# Patient Record
Sex: Male | Born: 1945 | Race: White | Hispanic: No | Smoking: Never smoker
Health system: Southern US, Community
[De-identification: ages and names within clinical notes are randomized; demographics above are authoritative.]

## PROBLEM LIST (undated history)

## (undated) DIAGNOSIS — K219 Gastro-esophageal reflux disease without esophagitis: Secondary | ICD-10-CM

## (undated) DIAGNOSIS — I10 Essential (primary) hypertension: Secondary | ICD-10-CM

## (undated) DIAGNOSIS — E119 Type 2 diabetes mellitus without complications: Secondary | ICD-10-CM

## (undated) HISTORY — DX: Gastro-esophageal reflux disease without esophagitis: K21.9

## (undated) HISTORY — DX: Essential (primary) hypertension: I10

## (undated) HISTORY — DX: Type 2 diabetes mellitus without complications: E11.9

## (undated) HISTORY — PX: CHOLECYSTECTOMY: SHX55

---

## 1997-11-29 ENCOUNTER — Ambulatory Visit (HOSPITAL_COMMUNITY): Admission: RE | Admit: 1997-11-29 | Discharge: 1997-11-30 | Payer: Self-pay | Admitting: Ophthalmology

## 1999-03-20 ENCOUNTER — Encounter: Payer: Self-pay | Admitting: Ophthalmology

## 1999-03-20 ENCOUNTER — Ambulatory Visit (HOSPITAL_COMMUNITY): Admission: RE | Admit: 1999-03-20 | Discharge: 1999-03-20 | Payer: Self-pay | Admitting: Ophthalmology

## 2002-11-30 ENCOUNTER — Encounter: Payer: Self-pay | Admitting: Ophthalmology

## 2002-11-30 ENCOUNTER — Ambulatory Visit (HOSPITAL_COMMUNITY): Admission: RE | Admit: 2002-11-30 | Discharge: 2002-12-01 | Payer: Self-pay | Admitting: Ophthalmology

## 2002-12-14 ENCOUNTER — Observation Stay (HOSPITAL_COMMUNITY): Admission: RE | Admit: 2002-12-14 | Discharge: 2002-12-15 | Payer: Self-pay | Admitting: Ophthalmology

## 2004-04-30 HISTORY — PX: COLONOSCOPY: SHX174

## 2005-03-12 ENCOUNTER — Ambulatory Visit: Payer: Self-pay | Admitting: General Surgery

## 2009-04-30 HISTORY — PX: KNEE SURGERY: SHX244

## 2011-04-17 DIAGNOSIS — E119 Type 2 diabetes mellitus without complications: Secondary | ICD-10-CM | POA: Insufficient documentation

## 2011-04-17 DIAGNOSIS — K219 Gastro-esophageal reflux disease without esophagitis: Secondary | ICD-10-CM | POA: Insufficient documentation

## 2011-04-17 DIAGNOSIS — M1990 Unspecified osteoarthritis, unspecified site: Secondary | ICD-10-CM | POA: Insufficient documentation

## 2011-04-17 DIAGNOSIS — H3320 Serous retinal detachment, unspecified eye: Secondary | ICD-10-CM | POA: Insufficient documentation

## 2011-04-17 DIAGNOSIS — I10 Essential (primary) hypertension: Secondary | ICD-10-CM | POA: Insufficient documentation

## 2014-05-25 DIAGNOSIS — Z0000 Encounter for general adult medical examination without abnormal findings: Secondary | ICD-10-CM | POA: Diagnosis not present

## 2014-05-25 DIAGNOSIS — Z125 Encounter for screening for malignant neoplasm of prostate: Secondary | ICD-10-CM | POA: Diagnosis not present

## 2014-06-17 DIAGNOSIS — Z08 Encounter for follow-up examination after completed treatment for malignant neoplasm: Secondary | ICD-10-CM | POA: Diagnosis not present

## 2014-06-17 DIAGNOSIS — L570 Actinic keratosis: Secondary | ICD-10-CM | POA: Diagnosis not present

## 2014-06-17 DIAGNOSIS — Z85820 Personal history of malignant melanoma of skin: Secondary | ICD-10-CM | POA: Diagnosis not present

## 2014-06-17 DIAGNOSIS — Z1283 Encounter for screening for malignant neoplasm of skin: Secondary | ICD-10-CM | POA: Diagnosis not present

## 2014-06-17 DIAGNOSIS — Z872 Personal history of diseases of the skin and subcutaneous tissue: Secondary | ICD-10-CM | POA: Diagnosis not present

## 2014-07-07 DIAGNOSIS — E1165 Type 2 diabetes mellitus with hyperglycemia: Secondary | ICD-10-CM | POA: Diagnosis not present

## 2014-07-07 DIAGNOSIS — I10 Essential (primary) hypertension: Secondary | ICD-10-CM | POA: Diagnosis not present

## 2014-07-07 DIAGNOSIS — R7309 Other abnormal glucose: Secondary | ICD-10-CM | POA: Diagnosis not present

## 2014-08-10 DIAGNOSIS — H4011X3 Primary open-angle glaucoma, severe stage: Secondary | ICD-10-CM | POA: Diagnosis not present

## 2014-08-24 ENCOUNTER — Encounter: Payer: Self-pay | Admitting: General Surgery

## 2014-08-25 ENCOUNTER — Encounter: Payer: Self-pay | Admitting: General Surgery

## 2014-08-25 ENCOUNTER — Ambulatory Visit (INDEPENDENT_AMBULATORY_CARE_PROVIDER_SITE_OTHER): Payer: Medicare Other | Admitting: General Surgery

## 2014-08-25 VITALS — BP 134/72 | HR 74 | Resp 12 | Ht 70.0 in | Wt 224.0 lb

## 2014-08-25 DIAGNOSIS — Z1211 Encounter for screening for malignant neoplasm of colon: Secondary | ICD-10-CM | POA: Diagnosis not present

## 2014-08-25 MED ORDER — POLYETHYLENE GLYCOL 3350 17 GM/SCOOP PO POWD: ORAL | Status: DC

## 2014-08-25 MED ORDER — AMOXICILLIN 500 MG PO TABS: ORAL_TABLET | ORAL | Status: DC

## 2014-08-25 NOTE — Patient Instructions (Addendum)
Colonoscopy A colonoscopy is an exam to look at the entire large intestine (colon). This exam can help find problems such as tumors, polyps, inflammation, and areas of bleeding. The exam takes about 1 hour.  LET [facility] CARE PROVIDER KNOW ABOUT:   Any allergies you have.  All medicines you are taking, including vitamins, herbs, eye drops, creams, and over-the-counter medicines.  Previous problems you or members of your family have had with the use of anesthetics.  Any blood disorders you have.  Previous surgeries you have had.  Medical conditions you have. RISKS AND COMPLICATIONS  Generally, this is a safe procedure. However, as with any procedure, complications can occur. Possible complications include:  Bleeding.  Tearing or rupture of the colon wall.  Reaction to medicines given during the exam.  Infection (rare). BEFORE THE PROCEDURE   Ask your health care provider about changing or stopping your regular medicines.  You may be prescribed an oral bowel prep. This involves drinking a large amount of medicated liquid, starting the day before your procedure. The liquid will cause you to have multiple loose stools until your stool is almost clear or light green. This cleans out your colon in preparation for the procedure.  Do not eat or drink anything else once you have started the bowel prep, unless your health care provider tells you it is safe to do so.  Arrange for someone to drive you home after the procedure. PROCEDURE   You will be given medicine to help you relax (sedative).  You will lie on your side with your knees bent.  A long, flexible tube with a light and camera on the end (colonoscope) will be inserted through the rectum and into the colon. The camera sends video back to a computer screen as it moves through the colon. The colonoscope also releases carbon dioxide gas to inflate the colon. This helps your health care provider see the area better.  During  the exam, your health care provider may take a small tissue sample (biopsy) to be examined under a microscope if any abnormalities are found.  The exam is finished when the entire colon has been viewed. AFTER THE PROCEDURE   Do not drive for 24 hours after the exam.  You may have a small amount of blood in your stool.  You may pass moderate amounts of gas and have mild abdominal cramping or bloating. This is caused by the gas used to inflate your colon during the exam.  Ask when your test results will be ready and how you will get your results. Make sure you get your test results. Document Released: 04/13/2000 Document Revised: 02/04/2013 Document Reviewed: 12/22/2012 [facility] Patient Information 2015 [place], [place]. This information is not intended to replace advice given to you by your health care provider. Make sure you discuss any questions you have with your health care provider.  Patient has been scheduled for a colonoscopy on 10-27-14 at [facility]. It is okay for patient to continue 81 mg aspirin once daily. Patient will hold Metformin and Amaryl day of colonoscopy prep and procedure. Insulin as usual. This patient will take Amoxicillin 4-500 mg tablets one hour pre-op.

## 2014-08-25 NOTE — Progress Notes (Signed)
Patient ID: Paul Hughes, male   DOB: 09-07-1945, 69 y.o.   MRN: [id number]  Chief Complaint  Patient presents with  . Other    colonoscopy    HPI [name] is a 69 y.o. male here today for a evaluation of a colonoscopy. Patient last  colonoscopy was done in 2006. No GI problems at this time. He states he moves his bowels after every meal, a somewhat increased frequency over the last 3-5 years with no recent change. No history of blood or mucus in the stool.  HPI  Past Medical History  Diagnosis Date  . Diabetes mellitus without complication   . Hypertension   . GERD (gastroesophageal reflux disease)     Past Surgical History  Procedure Laterality Date  . Colonoscopy  2006  . Knee surgery Right 2011    Prosthetic knee replacement.    No family history on file.  Social History History  Substance Use Topics  . Smoking status: Never Smoker   . Smokeless tobacco: Not on file  . Alcohol Use: No    Allergies  Allergen Reactions  . Codeine     Current Outpatient Prescriptions  Medication Sig Dispense Refill  . aspirin 81 MG tablet Take 81 mg by mouth daily.    . brimonidine (ALPHAGAN) 0.15 % ophthalmic solution 1 drop 3 (three) times daily.    Marland Kitchen glimepiride (AMARYL) 4 MG tablet Take 4 mg by mouth daily with breakfast.    . insulin detemir (LEVEMIR) 100 UNIT/ML injection Inject 34 Units into the skin at bedtime.     . lansoprazole (PREVACID) 15 MG capsule Take 15 mg by mouth daily at 12 noon.    Marland Kitchen lisinopril-hydrochlorothiazide (PRINZIDE,ZESTORETIC) 10-12.5 MG per tablet Take 1 tablet by mouth daily.    . metFORMIN (GLUCOPHAGE) 1000 MG tablet Take 1,000 mg by mouth 2 (two) times daily with a meal.    . amoxicillin (AMOXIL) 500 MG tablet Take 4 tablets one hour pre-operatively. 4 tablet 0  . polyethylene glycol powder (GLYCOLAX/MIRALAX) powder 255 grams one bottle for colonoscopy prep 255 g 0   No current facility-administered medications for this visit.     Review of Systems Review of Systems  Constitutional: Negative.   Respiratory: Negative.   Cardiovascular: Negative.     Blood pressure 134/72, pulse 74, resp. rate 12, height 5\' 10"  (1.778 m), weight 224 lb (101.606 kg).  Physical Exam Physical Exam  Constitutional: He is oriented to person, place, and time. He appears well-developed and well-nourished.  Cardiovascular: Normal rate, regular rhythm and normal heart sounds.   Pulmonary/Chest: Effort normal and breath sounds normal.  Abdominal: Soft. Bowel sounds are normal. There is no tenderness.  Neurological: He is alert and oriented to person, place, and time.  Skin: Skin is warm and dry.    Data Reviewed November 2006 colonoscopy results reviewed. Normal.  PCP notes of 05/25/2014 reviewed.    Assessment    Candidate for repeat colonoscopy as a screening procedure.    Plan    Colonoscopy with possible biopsy/polypectomy prn: Information regarding the procedure, including its potential risks and complications (including but not limited to perforation of the bowel, which may require emergency surgery to repair, and bleeding) was verbally given to the patient. Educational information regarding lower instestinal endoscopy was given to the patient. Written instructions for how to complete the bowel prep using Miralax were provided. The importance of drinking ample fluids to avoid dehydration as a result of the prep emphasized.  Patient has been scheduled for a colonoscopy on 10-27-14 at [facility]. It is okay for patient to continue 81 mg aspirin once daily. Patient will hold Metformin and Amaryl day of colonoscopy prep and procedure. Insulin as usual. Based on his history of a total joint replacement and the recommendations of the local orthopedic community antibiotic prophylaxis will be undertaken. This patient will take Amoxicillin 4-500 mg tablets one hour pre-op.   PCP:  Theresia Lo 08/25/2014, 8:02  PM

## 2014-09-20 DIAGNOSIS — I959 Hypotension, unspecified: Secondary | ICD-10-CM | POA: Diagnosis not present

## 2014-09-20 DIAGNOSIS — E119 Type 2 diabetes mellitus without complications: Secondary | ICD-10-CM | POA: Diagnosis not present

## 2014-09-20 DIAGNOSIS — I951 Orthostatic hypotension: Secondary | ICD-10-CM | POA: Diagnosis not present

## 2014-09-20 DIAGNOSIS — R000 Tachycardia, unspecified: Secondary | ICD-10-CM | POA: Diagnosis not present

## 2014-09-20 DIAGNOSIS — I9589 Other hypotension: Secondary | ICD-10-CM | POA: Diagnosis not present

## 2014-09-20 DIAGNOSIS — Z5181 Encounter for therapeutic drug level monitoring: Secondary | ICD-10-CM | POA: Diagnosis not present

## 2014-09-20 DIAGNOSIS — Z7982 Long term (current) use of aspirin: Secondary | ICD-10-CM | POA: Diagnosis not present

## 2014-09-20 DIAGNOSIS — E1165 Type 2 diabetes mellitus with hyperglycemia: Secondary | ICD-10-CM | POA: Diagnosis not present

## 2014-09-20 DIAGNOSIS — Z79899 Other long term (current) drug therapy: Secondary | ICD-10-CM | POA: Diagnosis not present

## 2014-09-20 DIAGNOSIS — Z794 Long term (current) use of insulin: Secondary | ICD-10-CM | POA: Diagnosis not present

## 2014-10-15 ENCOUNTER — Other Ambulatory Visit: Payer: Self-pay | Admitting: General Surgery

## 2014-10-15 DIAGNOSIS — Z1211 Encounter for screening for malignant neoplasm of colon: Secondary | ICD-10-CM

## 2014-10-15 NOTE — H&P (Signed)
Expand All Collapse All   Patient ID: Paul Hughes, male DOB: [birth year], 69 y.o. MRN: [id number]  Chief Complaint  Patient presents with  . Other    colonoscopy    HPI Paul Hughes is a 69 y.o. male here today for a evaluation of a colonoscopy. Patient last colonoscopy was done in 2006. No GI problems at this time. He states he moves his bowels after every meal, a somewhat increased frequency over the last 3-5 years with no recent change. No history of blood or mucus in the stool.  HPI  Past Medical History  Diagnosis Date  . Diabetes mellitus without complication   . Hypertension   . GERD (gastroesophageal reflux disease)     Past Surgical History  Procedure Laterality Date  . Colonoscopy  2006  . Knee surgery Right 2011    Prosthetic knee replacement.    No family history on file.  Social History History  Substance Use Topics  . Smoking status: Never Smoker   . Smokeless tobacco: Not on file  . Alcohol Use: No    Allergies  Allergen Reactions  . Codeine     Current Outpatient Prescriptions  Medication Sig Dispense Refill  . aspirin 81 MG tablet Take 81 mg by mouth daily.    . brimonidine (ALPHAGAN) 0.15 % ophthalmic solution 1 drop 3 (three) times daily.    Marland Kitchen glimepiride (AMARYL) 4 MG tablet Take 4 mg by mouth daily with breakfast.    . insulin detemir (LEVEMIR) 100 UNIT/ML injection Inject 34 Units into the skin at bedtime.     . lansoprazole (PREVACID) 15 MG capsule Take 15 mg by mouth daily at 12 noon.    Marland Kitchen lisinopril-hydrochlorothiazide (PRINZIDE,ZESTORETIC) 10-12.5 MG per tablet Take 1 tablet by mouth daily.    . metFORMIN (GLUCOPHAGE) 1000 MG tablet Take 1,000 mg by mouth 2 (two) times daily with a meal.    . amoxicillin (AMOXIL) 500 MG tablet Take 4 tablets one hour pre-operatively. 4 tablet 0  . polyethylene glycol powder  (GLYCOLAX/MIRALAX) powder 255 grams one bottle for colonoscopy prep 255 g 0   No current facility-administered medications for this visit.    Review of Systems Review of Systems  Constitutional: Negative.  Respiratory: Negative.  Cardiovascular: Negative.    Blood pressure 134/72, pulse 74, resp. rate 12, height 5\' 10"  (1.778 m), weight 224 lb (101.606 kg).  Physical Exam Physical Exam  Constitutional: He is oriented to person, place, and time. He appears well-developed and well-nourished.  Cardiovascular: Normal rate, regular rhythm and normal heart sounds.  Pulmonary/Chest: Effort normal and breath sounds normal.  Abdominal: Soft. Bowel sounds are normal. There is no tenderness.  Neurological: He is alert and oriented to person, place, and time.  Skin: Skin is warm and dry.    Data Reviewed November 2006 colonoscopy results reviewed. Normal.  PCP notes of 05/25/2014 reviewed.    Assessment    Candidate for repeat colonoscopy as a screening procedure.    Plan    Colonoscopy with possible biopsy/polypectomy prn: Information regarding the procedure, including its potential risks and complications (including but not limited to perforation of the bowel, which may require emergency surgery to repair, and bleeding) was verbally given to the patient. Educational information regarding lower instestinal endoscopy was given to the patient. Written instructions for how to complete the bowel prep using Miralax were provided. The importance of drinking ample fluids to avoid dehydration as a result of the prep emphasized.    Patient  has been scheduled for a colonoscopy on 10-27-14 at [facility]. It is okay for patient to continue 81 mg aspirin once daily. Patient will hold Metformin and Amaryl day of colonoscopy prep and procedure. Insulin as usual. Based on his history of a total joint replacement and the recommendations of the local orthopedic community antibiotic prophylaxis will  be undertaken. This patient will take Amoxicillin 4-500 mg tablets one hour pre-op.    PCP: Theresia Lo

## 2014-10-20 ENCOUNTER — Telehealth: Payer: Self-pay | Admitting: *Deleted

## 2014-10-20 NOTE — Telephone Encounter (Signed)
Patient was contacted today and confirms no medication changes since last office visit.   This patient reports he has Miralax prescription and he will try to call and pre-register by phone. Patient also reminded about diabetic prep prior to colonoscopy and oral antibiotics.   We will proceed with colonoscopy that is scheduled at [facility] for 10-27-14.   Patient was instructed to call the office should he have further questions.

## 2014-10-27 ENCOUNTER — Encounter
Admission: RE | Disposition: A | Discharge status: Home / Self Care | Payer: Self-pay | Source: Ambulatory Visit | Attending: General Surgery

## 2014-10-27 ENCOUNTER — Ambulatory Visit: Payer: Medicare Other | Admitting: Anesthesiology

## 2014-10-27 ENCOUNTER — Ambulatory Visit
Admission: RE | Admit: 2014-10-27 | Discharge: 2014-10-27 | Disposition: A | Discharge status: Home / Self Care | Payer: Medicare Other | Source: Ambulatory Visit | Attending: General Surgery | Admitting: General Surgery

## 2014-10-27 ENCOUNTER — Encounter: Payer: Self-pay | Admitting: *Deleted

## 2014-10-27 DIAGNOSIS — Z1211 Encounter for screening for malignant neoplasm of colon: Secondary | ICD-10-CM | POA: Diagnosis not present

## 2014-10-27 DIAGNOSIS — Z7982 Long term (current) use of aspirin: Secondary | ICD-10-CM | POA: Diagnosis not present

## 2014-10-27 DIAGNOSIS — K219 Gastro-esophageal reflux disease without esophagitis: Secondary | ICD-10-CM | POA: Insufficient documentation

## 2014-10-27 DIAGNOSIS — E119 Type 2 diabetes mellitus without complications: Secondary | ICD-10-CM | POA: Diagnosis not present

## 2014-10-27 DIAGNOSIS — Z79899 Other long term (current) drug therapy: Secondary | ICD-10-CM | POA: Insufficient documentation

## 2014-10-27 DIAGNOSIS — I10 Essential (primary) hypertension: Secondary | ICD-10-CM | POA: Diagnosis not present

## 2014-10-27 HISTORY — PX: COLONOSCOPY: SHX5424

## 2014-10-27 LAB — GLUCOSE, CAPILLARY: Glucose-Capillary: 188 mg/dL — ABNORMAL HIGH (ref 65–99)

## 2014-10-27 SURGERY — COLONOSCOPY
Anesthesia: General

## 2014-10-27 MED ORDER — SODIUM CHLORIDE 0.9 % IV SOLN
INTRAVENOUS | Status: DC
  Administered 2014-10-27: 08:00:00 via INTRAVENOUS

## 2014-10-27 MED ORDER — PROPOFOL INFUSION 10 MG/ML OPTIME
INTRAVENOUS | Status: DC | PRN
  Administered 2014-10-27: 100 ug/kg/min via INTRAVENOUS

## 2014-10-27 MED ORDER — MIDAZOLAM HCL 5 MG/5ML IJ SOLN
INTRAMUSCULAR | Status: DC | PRN
  Administered 2014-10-27: 1 mg via INTRAVENOUS

## 2014-10-27 MED ORDER — FENTANYL CITRATE (PF) 100 MCG/2ML IJ SOLN
INTRAMUSCULAR | Status: DC | PRN
  Administered 2014-10-27: 50 ug via INTRAVENOUS

## 2014-10-27 MED ORDER — PROPOFOL 10 MG/ML IV BOLUS
INTRAVENOUS | Status: DC | PRN
  Administered 2014-10-27: 50 mg via INTRAVENOUS

## 2014-10-27 MED ORDER — LIDOCAINE HCL (PF) 2 % IJ SOLN
INTRAMUSCULAR | Status: DC | PRN
  Administered 2014-10-27: 50 mg

## 2014-10-27 NOTE — Discharge Instructions (Signed)
Repeat exam in 10 years. Resume usual insulin dosing tomorrow. No ladders, driving, power tools today.

## 2014-10-27 NOTE — H&P (Signed)
No change in clinical history or cardiopulmonary exam.

## 2014-10-27 NOTE — Anesthesia Preprocedure Evaluation (Signed)
Anesthesia Evaluation  Patient identified by MRN, date of birth, ID band Patient awake    Reviewed: Allergy & Precautions, H&P , NPO status , Patient's Chart, lab work & pertinent test results, reviewed documented beta blocker date and time   Airway Mallampati: II  TM Distance: >3 FB Neck ROM: full    Dental no notable dental hx.    Pulmonary neg pulmonary ROS,  breath sounds clear to auscultation  Pulmonary exam normal       Cardiovascular Exercise Tolerance: Good hypertension, negative cardio ROS  Rhythm:regular Rate:Normal     Neuro/Psych negative neurological ROS  negative psych ROS   GI/Hepatic negative GI ROS, Neg liver ROS,   Endo/Other  negative endocrine ROSdiabetesTook am levemir  Renal/GU negative Renal ROS  negative genitourinary   Musculoskeletal   Abdominal   Peds  Hematology negative hematology ROS (+)   Anesthesia Other Findings   Reproductive/Obstetrics negative OB ROS                             Anesthesia Physical Anesthesia Plan  ASA: II  Anesthesia Plan: General   Post-op Pain Management:    Induction:   Airway Management Planned:   Additional Equipment:   Intra-op Plan:   Post-operative Plan:   Informed Consent: I have reviewed the patients History and Physical, chart, labs and discussed the procedure including the risks, benefits and alternatives for the proposed anesthesia with the patient or authorized representative who has indicated his/her understanding and acceptance.   Dental Advisory Given  Plan Discussed with: CRNA  Anesthesia Plan Comments:         Anesthesia Quick Evaluation

## 2014-10-27 NOTE — Transfer of Care (Signed)
Immediate Anesthesia Transfer of Care Note  Patient: Paul Hughes  Procedure(s) Performed: Procedure(s): COLONOSCOPY (N/A)  Patient Location: PACU  Anesthesia Type:General  Level of Consciousness: sedated  Airway & Oxygen Therapy: Patient Spontanous Breathing and Patient connected to nasal cannula oxygen  Post-op Assessment: Report given to RN and Post -op Vital signs reviewed and stable  Post vital signs: Reviewed and stable  Last Vitals:  Filed Vitals:   10/27/14 0743  BP: 125/84  Pulse: 69  Temp: 36.2 C  Resp: 16    Complications: No apparent anesthesia complications

## 2014-10-27 NOTE — Op Note (Signed)
[facility] Patient Name: Paul Hughes Procedure Date: 10/27/2014 8:31 AM MRN: [id number] Account #: [id number] Date of Birth: 1946-03-22 Admit Type: Outpatient Age: 69 Room: [facility] ENDO ROOM 1 Gender: Male Note Status: Finalized Procedure:         Colonoscopy Indications:       Screening for colorectal malignant neoplasm Providers:         Robert Bellow, MD Referring MD:      Crissie Sickles (Referring MD) Medicines:         Monitored Anesthesia Care Complications:     No immediate complications. Procedure:         Pre-Anesthesia Assessment:                    - Prior to the procedure, a History and Physical was                     performed, and patient medications, allergies and                     sensitivities were reviewed. The patient's tolerance of                     previous anesthesia was reviewed.                    - The risks and benefits of the procedure and the sedation                     options and risks were discussed with the patient. All                     questions were answered and informed consent was obtained.                    After obtaining informed consent, the colonoscope was                     passed under direct vision. Throughout the procedure, the                     patient's blood pressure, pulse, and oxygen saturations                     were monitored continuously. The Colonoscope was                     introduced through the anus and advanced to the the cecum,                     identified by appendiceal orifice and ileocecal valve. The                     colonoscopy was performed without difficulty. The patient                     tolerated the procedure well. The quality of the bowel                     preparation was excellent. Findings:      The entire examined colon appeared normal on direct and retroflexion       views. Impression:        - The entire examined colon is normal on  direct and  retroflexion views.                    - No specimens collected. Recommendation:    - Repeat colonoscopy in 10 years for screening purposes. Procedure Code(s): --- Professional ---                    [phone], Colonoscopy, flexible; diagnostic, including                     collection of specimen(s) by brushing or washing, when                     performed (separate procedure) Diagnosis Code(s): --- Professional ---                    Z12.11, Encounter for screening for malignant neoplasm of                     colon CPT copyright 2014 American Medical Association. All rights reserved. The codes documented in this report are preliminary and upon coder review may  be revised to meet current compliance requirements. Robert Bellow, MD 10/27/2014 9:00:40 AM This report has been signed electronically. Number of Addenda: 0 Note Initiated On: 10/27/2014 8:31 AM Scope Withdrawal Time: 0 hours 6 minutes 5 seconds  Total Procedure Duration: 0 hours 9 minutes 2 seconds       [facility]

## 2014-11-02 NOTE — Anesthesia Postprocedure Evaluation (Signed)
  Anesthesia Post-op Note  Patient: Paul Hughes  Procedure(s) Performed: Procedure(s): COLONOSCOPY (N/A)  Anesthesia type:General  Patient location: PACU  Post pain: Pain level controlled  Post assessment: Post-op Vital signs reviewed, Patient's Cardiovascular Status Stable, Respiratory Function Stable, Patent Airway and No signs of Nausea or vomiting  Post vital signs: Reviewed and stable  Last Vitals:  Filed Vitals:   10/27/14 0940  BP: 125/77  Pulse:   Temp:   Resp:     Level of consciousness: awake, alert  and patient cooperative  Complications: No apparent anesthesia complications

## 2014-11-03 DIAGNOSIS — E1165 Type 2 diabetes mellitus with hyperglycemia: Secondary | ICD-10-CM | POA: Diagnosis not present

## 2015-01-27 DIAGNOSIS — I10 Essential (primary) hypertension: Secondary | ICD-10-CM | POA: Diagnosis not present

## 2015-01-27 DIAGNOSIS — E1165 Type 2 diabetes mellitus with hyperglycemia: Secondary | ICD-10-CM | POA: Diagnosis not present

## 2015-01-27 DIAGNOSIS — K219 Gastro-esophageal reflux disease without esophagitis: Secondary | ICD-10-CM | POA: Diagnosis not present

## 2015-01-27 DIAGNOSIS — Z23 Encounter for immunization: Secondary | ICD-10-CM | POA: Diagnosis not present

## 2015-01-27 DIAGNOSIS — Z794 Long term (current) use of insulin: Secondary | ICD-10-CM | POA: Diagnosis not present

## 2015-02-16 DIAGNOSIS — H401220 Low-tension glaucoma, left eye, stage unspecified: Secondary | ICD-10-CM | POA: Diagnosis not present

## 2015-05-04 DIAGNOSIS — E1165 Type 2 diabetes mellitus with hyperglycemia: Secondary | ICD-10-CM | POA: Diagnosis not present

## 2015-05-13 DIAGNOSIS — H4423 Degenerative myopia, bilateral: Secondary | ICD-10-CM | POA: Diagnosis not present

## 2015-05-13 DIAGNOSIS — H401133 Primary open-angle glaucoma, bilateral, severe stage: Secondary | ICD-10-CM | POA: Diagnosis not present

## 2015-05-13 DIAGNOSIS — H35372 Puckering of macula, left eye: Secondary | ICD-10-CM | POA: Diagnosis not present

## 2015-05-16 ENCOUNTER — Encounter: Payer: Self-pay | Admitting: Family Medicine

## 2015-07-07 DIAGNOSIS — Z1283 Encounter for screening for malignant neoplasm of skin: Secondary | ICD-10-CM | POA: Diagnosis not present

## 2015-07-07 DIAGNOSIS — Z872 Personal history of diseases of the skin and subcutaneous tissue: Secondary | ICD-10-CM | POA: Diagnosis not present

## 2015-07-07 DIAGNOSIS — D485 Neoplasm of uncertain behavior of skin: Secondary | ICD-10-CM | POA: Diagnosis not present

## 2015-07-07 DIAGNOSIS — Z08 Encounter for follow-up examination after completed treatment for malignant neoplasm: Secondary | ICD-10-CM | POA: Diagnosis not present

## 2015-07-07 DIAGNOSIS — D1801 Hemangioma of skin and subcutaneous tissue: Secondary | ICD-10-CM | POA: Diagnosis not present

## 2015-07-07 DIAGNOSIS — Z85820 Personal history of malignant melanoma of skin: Secondary | ICD-10-CM | POA: Diagnosis not present

## 2015-08-18 DIAGNOSIS — D225 Melanocytic nevi of trunk: Secondary | ICD-10-CM | POA: Diagnosis not present

## 2015-08-18 DIAGNOSIS — D485 Neoplasm of uncertain behavior of skin: Secondary | ICD-10-CM | POA: Diagnosis not present

## 2015-09-06 DIAGNOSIS — S31000A Unspecified open wound of lower back and pelvis without penetration into retroperitoneum, initial encounter: Secondary | ICD-10-CM | POA: Diagnosis not present

## 2015-09-22 DIAGNOSIS — E1165 Type 2 diabetes mellitus with hyperglycemia: Secondary | ICD-10-CM | POA: Diagnosis not present

## 2015-09-22 DIAGNOSIS — G629 Polyneuropathy, unspecified: Secondary | ICD-10-CM | POA: Diagnosis not present

## 2015-10-03 ENCOUNTER — Ambulatory Visit (INDEPENDENT_AMBULATORY_CARE_PROVIDER_SITE_OTHER): Payer: Medicare Other | Admitting: Family Medicine

## 2015-10-03 ENCOUNTER — Encounter: Payer: Self-pay | Admitting: Family Medicine

## 2015-10-03 VITALS — BP 130/76 | HR 76 | Temp 98.4°F | Resp 16 | Ht 69.0 in | Wt 233.6 lb

## 2015-10-03 DIAGNOSIS — K219 Gastro-esophageal reflux disease without esophagitis: Secondary | ICD-10-CM | POA: Diagnosis not present

## 2015-10-03 DIAGNOSIS — B3742 Candidal balanitis: Secondary | ICD-10-CM

## 2015-10-03 DIAGNOSIS — Z125 Encounter for screening for malignant neoplasm of prostate: Secondary | ICD-10-CM

## 2015-10-03 DIAGNOSIS — Z794 Long term (current) use of insulin: Secondary | ICD-10-CM | POA: Diagnosis not present

## 2015-10-03 DIAGNOSIS — I10 Essential (primary) hypertension: Secondary | ICD-10-CM

## 2015-10-03 DIAGNOSIS — R351 Nocturia: Secondary | ICD-10-CM

## 2015-10-03 DIAGNOSIS — Z0000 Encounter for general adult medical examination without abnormal findings: Secondary | ICD-10-CM

## 2015-10-03 DIAGNOSIS — E1169 Type 2 diabetes mellitus with other specified complication: Secondary | ICD-10-CM | POA: Diagnosis not present

## 2015-10-03 DIAGNOSIS — E785 Hyperlipidemia, unspecified: Secondary | ICD-10-CM | POA: Diagnosis not present

## 2015-10-03 DIAGNOSIS — E119 Type 2 diabetes mellitus without complications: Secondary | ICD-10-CM | POA: Diagnosis not present

## 2015-10-03 MED ORDER — NYSTATIN 100000 UNIT/GM EX CREA: 1.0000 "application " | TOPICAL_CREAM | Freq: Three times a day (TID) | CUTANEOUS | Status: DC

## 2015-10-03 NOTE — Progress Notes (Signed)
Subjective:     Patient ID: Paul Hughes, male   DOB: [birth year], 70 y.o.   MRN: [id number]  HPI  Chief Complaint  Patient presents with  . Medicare Wellness    Patient comes in office today for his annual physical he states that he has no questions or conerns today. Last reported colonoscopy was 10/27/14 WNL, patient reports that he has had a Tdap booster within the past 10 years  Continues to be followed by [facility] for diabetes; Dr. Junaid Heal, dermatology; Marvene Staff with exam pending; and dentist, Dr. Atilano Median, with exam pending. He has had Met profile at [facility] in May with normal GFR.   Review of Systems General: Feeling well. Reports pneumonia shot at [facility] in 2015.States he was recently switched to Humulin  70/30 and feels his sugars are improving but still not at goal. HEENT: regular dental visits and eye exams Cardiovascular: no chest pain, shortness of breath, or palpitations GI: heartburn controlled with daily Prevacid. no change in bowel habits with normal colonoscopy in the past year. GU: nocturia x 0-3, no change in bladder habits. Has recurrent for months fissuring and white rash on his foreskin. 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 No distress.  Eyes: PERRLA, EOMI Neck: no thyromegaly, tenderness or nodules, carotids palpable without bruits, no cervical adenopathy ENT: TM's intact without inflammation; No tonsillar enlargement or exudate, Lungs: Clear Heart : RRR without murmur or gallop Abd: bowel sounds present, soft, non-tender, no organomegaly Genitals: white rash/fissuring on foreskin; mild difficulty to retract, glans with mild inflammation but no rash Rectal: Prostate firm, enlarged and non-tender Extremities: no edema Skin: no atypical lesions noted.     Assessment:    1.  Type 2 diabetes mellitus without complication, with long-term current use of insulin (Tensed): per [facility]  2. Gastroesophageal reflux disease without esophagitis: controlled on PPI will try to switch to H2 blocker.  3. Candidal balanitis - nystatin cream (MYCOSTATIN); Apply 1 application topically 3 (three) times daily. To foreskin/penis  Dispense: 30 g; Refill: 1  4. Screening for prostate cancer - PSA  5. Encounter for Medicare annual wellness exam  6. Nocturia - PSA  7. Hyperlipidemia associated with type 2 diabetes mellitus (HCC) - Lipid panel 8. Hypertension: stable on current medication    Plan:    Further f/u pending lab work. Discussed ways to come off of daily Prevacid.

## 2015-10-03 NOTE — Patient Instructions (Addendum)
Don't forget to get your fasting labs. We will call you with the results. Try Zantac 150 mg. And antacid In place of Prevacid.  If that doesn't work try taking Prevacid every other day for 2 weeks then every third day for two weeks then stop. Use Zantac for flareups.

## 2015-10-04 ENCOUNTER — Telehealth: Payer: Self-pay | Admitting: Family Medicine

## 2015-10-04 DIAGNOSIS — E1169 Type 2 diabetes mellitus with other specified complication: Secondary | ICD-10-CM | POA: Diagnosis not present

## 2015-10-04 DIAGNOSIS — Z0000 Encounter for general adult medical examination without abnormal findings: Secondary | ICD-10-CM | POA: Diagnosis not present

## 2015-10-04 DIAGNOSIS — E785 Hyperlipidemia, unspecified: Secondary | ICD-10-CM | POA: Diagnosis not present

## 2015-10-04 DIAGNOSIS — Z125 Encounter for screening for malignant neoplasm of prostate: Secondary | ICD-10-CM | POA: Diagnosis not present

## 2015-10-04 DIAGNOSIS — R351 Nocturia: Secondary | ICD-10-CM | POA: Diagnosis not present

## 2015-10-04 NOTE — Telephone Encounter (Signed)
Patient states that he had pneumonia shot in 2014 and he had the bloodwork done what you wanted him to do

## 2015-10-05 ENCOUNTER — Other Ambulatory Visit: Payer: Self-pay | Admitting: Family Medicine

## 2015-10-05 DIAGNOSIS — E119 Type 2 diabetes mellitus without complications: Secondary | ICD-10-CM

## 2015-10-05 DIAGNOSIS — Z794 Long term (current) use of insulin: Principal | ICD-10-CM

## 2015-10-05 LAB — LIPID PANEL
Chol/HDL Ratio: 5.9 ratio units — ABNORMAL HIGH (ref 0.0–5.0)
Cholesterol, Total: 177 mg/dL (ref 100–199)
HDL: 30 mg/dL — ABNORMAL LOW (ref >39)
LDL CALC: 83 mg/dL (ref 0–99)
TRIGLYCERIDES: 319 mg/dL — AB (ref 0–149)
VLDL CHOLESTEROL CAL: 64 mg/dL — AB (ref 5–40)

## 2015-10-05 LAB — PSA: Prostate Specific Ag, Serum: 4 ng/mL (ref 0.0–4.0)

## 2015-10-05 MED ORDER — ATORVASTATIN CALCIUM 40 MG PO TABS
40.0000 mg | ORAL_TABLET | Freq: Every day | ORAL | Status: DC
Start: 2015-10-05 — End: 2018-09-05

## 2015-10-05 NOTE — Telephone Encounter (Signed)
-----   Message from Carmon Ginsberg, [place] sent at 10/05/2015  7:56 AM EDT ----- Prostate blood test is lower than last year and within high normal range. Your LDL (bad) cholesterol is good at 83. HDL (good) cholesterol is  low at 30 Recommendations are usually to place diabetics on cholesterol lowering medication no matter what their cholesterol is. Do you wish to start a statin medication?

## 2015-10-05 NOTE — Telephone Encounter (Signed)
Atorvastatin sent in 

## 2015-10-05 NOTE — Telephone Encounter (Signed)
Patient has been advised he would like statin drug sent to [facility]. KW

## 2015-10-13 DIAGNOSIS — Z7984 Long term (current) use of oral hypoglycemic drugs: Secondary | ICD-10-CM | POA: Diagnosis not present

## 2015-10-13 DIAGNOSIS — Z794 Long term (current) use of insulin: Secondary | ICD-10-CM | POA: Diagnosis not present

## 2015-10-13 DIAGNOSIS — E1165 Type 2 diabetes mellitus with hyperglycemia: Secondary | ICD-10-CM | POA: Diagnosis not present

## 2015-11-11 DIAGNOSIS — E119 Type 2 diabetes mellitus without complications: Secondary | ICD-10-CM | POA: Diagnosis not present

## 2015-11-11 DIAGNOSIS — H401134 Primary open-angle glaucoma, bilateral, indeterminate stage: Secondary | ICD-10-CM | POA: Diagnosis not present

## 2016-01-24 ENCOUNTER — Telehealth: Payer: Self-pay | Admitting: Family Medicine

## 2016-01-24 ENCOUNTER — Other Ambulatory Visit: Payer: Self-pay | Admitting: Family Medicine

## 2016-01-24 DIAGNOSIS — B3742 Candidal balanitis: Secondary | ICD-10-CM

## 2016-01-24 MED ORDER — NYSTATIN 100000 UNIT/GM EX CREA: 1.0000 "application " | TOPICAL_CREAM | Freq: Three times a day (TID) | CUTANEOUS | 1 refills | Status: DC

## 2016-01-24 NOTE — Telephone Encounter (Signed)
Unable to reach patient at home left voicemail on cellphone to call office back. KW

## 2016-01-24 NOTE — Telephone Encounter (Signed)
Pt needs refill on his nystatin cream.  [facility]  Thanks, teri

## 2016-01-24 NOTE — Telephone Encounter (Signed)
[id number] 

## 2016-01-24 NOTE — Telephone Encounter (Signed)
Spoke with patient  On the phone who states that he has a rash on his penis that has been intermittent. Patient states that he was sen for a physical this year and prescribed Nystatin with refill. He states that he has been using cream twice a day and is almost out of his last tube. Patient request that you refill Nystatin. Patient also wanted to know if there is anything he can take to decrease bowel movements? Patient states that he has frequent bowl movements during and after eating a meal and wanted to know if there was anything he can take? Please advise. KW

## 2016-01-24 NOTE — Telephone Encounter (Signed)
I have refilled Nystatin cream. May try imodium for his bowels but most likely is the metformin if the frequent stools have been going on for a while. May wish to discuss this also with his diabetes doctor.

## 2016-01-24 NOTE — Telephone Encounter (Signed)
Pt is returning call.  [id number]

## 2016-01-25 NOTE — Telephone Encounter (Signed)
[id number] 

## 2016-01-25 NOTE — Telephone Encounter (Signed)
Patient has been advised. KW 

## 2016-02-01 ENCOUNTER — Telehealth: Payer: Self-pay | Admitting: Family Medicine

## 2016-02-01 NOTE — Telephone Encounter (Signed)
Pt is returning call.  [id number]

## 2016-02-01 NOTE — Telephone Encounter (Signed)
Spoke with patient on the phone he stated that he missed a call from our office this morning. I didn't see any previous message in chart that would suggest that we called today. Did you contact patient? If you did he states that he can be reached at [phone]. KW

## 2016-02-01 NOTE — Telephone Encounter (Signed)
No , I didn't call

## 2016-02-01 NOTE — Telephone Encounter (Signed)
[id number] 

## 2016-02-16 DIAGNOSIS — Z1283 Encounter for screening for malignant neoplasm of skin: Secondary | ICD-10-CM | POA: Diagnosis not present

## 2016-02-16 DIAGNOSIS — D2261 Melanocytic nevi of right upper limb, including shoulder: Secondary | ICD-10-CM | POA: Diagnosis not present

## 2016-02-16 DIAGNOSIS — Z85820 Personal history of malignant melanoma of skin: Secondary | ICD-10-CM | POA: Diagnosis not present

## 2016-02-16 DIAGNOSIS — L570 Actinic keratosis: Secondary | ICD-10-CM | POA: Diagnosis not present

## 2016-02-16 DIAGNOSIS — D2272 Melanocytic nevi of left lower limb, including hip: Secondary | ICD-10-CM | POA: Diagnosis not present

## 2016-02-16 DIAGNOSIS — Z872 Personal history of diseases of the skin and subcutaneous tissue: Secondary | ICD-10-CM | POA: Diagnosis not present

## 2016-02-16 DIAGNOSIS — Z08 Encounter for follow-up examination after completed treatment for malignant neoplasm: Secondary | ICD-10-CM | POA: Diagnosis not present

## 2016-02-23 DIAGNOSIS — Z794 Long term (current) use of insulin: Secondary | ICD-10-CM | POA: Diagnosis not present

## 2016-02-23 DIAGNOSIS — Z23 Encounter for immunization: Secondary | ICD-10-CM | POA: Diagnosis not present

## 2016-02-23 DIAGNOSIS — E1165 Type 2 diabetes mellitus with hyperglycemia: Secondary | ICD-10-CM | POA: Diagnosis not present

## 2016-02-29 DIAGNOSIS — Z794 Long term (current) use of insulin: Secondary | ICD-10-CM | POA: Diagnosis not present

## 2016-02-29 DIAGNOSIS — E119 Type 2 diabetes mellitus without complications: Secondary | ICD-10-CM | POA: Diagnosis not present

## 2016-05-21 DIAGNOSIS — H401134 Primary open-angle glaucoma, bilateral, indeterminate stage: Secondary | ICD-10-CM | POA: Diagnosis not present

## 2016-05-21 DIAGNOSIS — H35372 Puckering of macula, left eye: Secondary | ICD-10-CM | POA: Diagnosis not present

## 2016-06-19 DIAGNOSIS — Z794 Long term (current) use of insulin: Secondary | ICD-10-CM | POA: Diagnosis not present

## 2016-06-19 DIAGNOSIS — E119 Type 2 diabetes mellitus without complications: Secondary | ICD-10-CM | POA: Diagnosis not present

## 2016-06-19 DIAGNOSIS — E669 Obesity, unspecified: Secondary | ICD-10-CM | POA: Diagnosis not present

## 2016-06-19 DIAGNOSIS — E1165 Type 2 diabetes mellitus with hyperglycemia: Secondary | ICD-10-CM | POA: Diagnosis not present

## 2016-06-19 DIAGNOSIS — Z5189 Encounter for other specified aftercare: Secondary | ICD-10-CM | POA: Diagnosis not present

## 2016-06-29 ENCOUNTER — Ambulatory Visit (INDEPENDENT_AMBULATORY_CARE_PROVIDER_SITE_OTHER): Payer: Medicare Other | Admitting: Family Medicine

## 2016-06-29 ENCOUNTER — Encounter: Payer: Self-pay | Admitting: Family Medicine

## 2016-06-29 VITALS — BP 122/86 | HR 88 | Temp 99.2°F | Resp 17 | Wt 229.0 lb

## 2016-06-29 DIAGNOSIS — J101 Influenza due to other identified influenza virus with other respiratory manifestations: Secondary | ICD-10-CM | POA: Diagnosis not present

## 2016-06-29 LAB — POC INFLUENZA A&B (BINAX/QUICKVUE)
Influenza A, POC: NEGATIVE
Influenza B, POC: POSITIVE — AB

## 2016-06-29 MED ORDER — OSELTAMIVIR PHOSPHATE 75 MG PO CAPS: 75.0000 mg | ORAL_CAPSULE | Freq: Two times a day (BID) | ORAL | 0 refills | Status: DC

## 2016-06-29 MED ORDER — BENZONATATE 100 MG PO CAPS: ORAL_CAPSULE | ORAL | 0 refills | Status: DC

## 2016-06-29 NOTE — Progress Notes (Signed)
Subjective:     Patient ID: KELL ELPERS, male   DOB: [birth year], 71 y.o.   MRN: [id number]  HPI  Chief Complaint  Patient presents with  . Cough    Patient comes to office today with concerns of cough and congestion since 06/26/16. Associated with cough patient complains of the following: chest pain, rib paibn, sinus pain.pressure, and sore throat. Patient has been taking otc Sudafed and throat lozenges.    States he had sore throat and body aches at the onset of symptoms. + flu shot. Developed vomiting after gagging with throat exam.   Review of Systems     Objective:   Physical Exam  Constitutional: He appears well-developed and well-nourished. No distress.  Ears: T.M's intact without inflammation Throat: no tonsillar enlargement or exudate Neck: no cervical adenopathy Lungs: clear; inspiration provokes cough     Assessment:    1. Influenza B - POC Influenza A&B(BINAX/QUICKVUE) - benzonatate (TESSALON PERLES) 100 MG capsule; One or two pills 3 x day as needed for cough  Dispense: 30 capsule; Refill: 0 - oseltamivir (TAMIFLU) 75 MG capsule; Take 1 capsule (75 mg total) by mouth 2 (two) times daily.  Dispense: 10 capsule; Refill: 0    Plan:    Continue Sudafed and also try Delsym for cough.

## 2016-06-29 NOTE — Patient Instructions (Signed)
Continue with Sudafed for congestion and Delsym for cough.

## 2016-07-04 ENCOUNTER — Other Ambulatory Visit: Payer: Self-pay | Admitting: Family Medicine

## 2016-07-04 ENCOUNTER — Telehealth: Payer: Self-pay | Admitting: Family Medicine

## 2016-07-04 DIAGNOSIS — J019 Acute sinusitis, unspecified: Secondary | ICD-10-CM

## 2016-07-04 MED ORDER — AMOXICILLIN-POT CLAVULANATE 875-125 MG PO TABS: 1.0000 | ORAL_TABLET | Freq: Two times a day (BID) | ORAL | 0 refills | Status: DC

## 2016-07-04 NOTE — Telephone Encounter (Signed)
The Tamiflu is only a treatment for 5 days. What symptoms are present right now?

## 2016-07-04 NOTE — Telephone Encounter (Signed)
LMTCB, need clarification on what drug patient is requesting to be refilled? KW

## 2016-07-04 NOTE — Telephone Encounter (Signed)
I have sent in Augmentin to cover for sinusitis 

## 2016-07-04 NOTE — Telephone Encounter (Signed)
Pt states he was seen last week and states he is feeling some better not a 100%.  Pt states he has taken all of the Rx that was given.  Pt is requesting a refill if possible.  [facility] [place]  GA#[phone]

## 2016-07-04 NOTE — Telephone Encounter (Signed)
Patient reports that he is feeling better but still has cough and yesterday had bloody nasal drainage.Patient states that he is concerned that if he dosen't take some form of prescription medication he will get sick again. Patient is currently not taking any otc medication. KW

## 2016-07-04 NOTE — Telephone Encounter (Signed)
Pt requesting Tamiflu refill. Renaldo Fiddler, CMA

## 2016-07-04 NOTE — Telephone Encounter (Signed)
Got disconnected from patient on phone, will wait for return call. KW

## 2016-07-04 NOTE — Telephone Encounter (Signed)
[id number] 

## 2016-07-04 NOTE — Telephone Encounter (Signed)
Patient advised.KW 

## 2016-07-12 DIAGNOSIS — L821 Other seborrheic keratosis: Secondary | ICD-10-CM | POA: Diagnosis not present

## 2016-07-12 DIAGNOSIS — Z872 Personal history of diseases of the skin and subcutaneous tissue: Secondary | ICD-10-CM | POA: Diagnosis not present

## 2016-07-12 DIAGNOSIS — Z85820 Personal history of malignant melanoma of skin: Secondary | ICD-10-CM | POA: Diagnosis not present

## 2016-07-12 DIAGNOSIS — Z86018 Personal history of other benign neoplasm: Secondary | ICD-10-CM | POA: Diagnosis not present

## 2016-07-12 DIAGNOSIS — L570 Actinic keratosis: Secondary | ICD-10-CM | POA: Diagnosis not present

## 2016-08-04 DIAGNOSIS — R6883 Chills (without fever): Secondary | ICD-10-CM | POA: Diagnosis not present

## 2016-08-04 DIAGNOSIS — A4189 Other specified sepsis: Secondary | ICD-10-CM | POA: Diagnosis not present

## 2016-08-04 DIAGNOSIS — R1011 Right upper quadrant pain: Secondary | ICD-10-CM | POA: Diagnosis not present

## 2016-08-04 DIAGNOSIS — K567 Ileus, unspecified: Secondary | ICD-10-CM | POA: Diagnosis not present

## 2016-08-04 DIAGNOSIS — K819 Cholecystitis, unspecified: Secondary | ICD-10-CM | POA: Diagnosis not present

## 2016-08-04 DIAGNOSIS — E669 Obesity, unspecified: Secondary | ICD-10-CM | POA: Diagnosis present

## 2016-08-04 DIAGNOSIS — E119 Type 2 diabetes mellitus without complications: Secondary | ICD-10-CM | POA: Diagnosis present

## 2016-08-04 DIAGNOSIS — N179 Acute kidney failure, unspecified: Secondary | ICD-10-CM | POA: Diagnosis present

## 2016-08-04 DIAGNOSIS — Z7982 Long term (current) use of aspirin: Secondary | ICD-10-CM | POA: Diagnosis not present

## 2016-08-04 DIAGNOSIS — Z96659 Presence of unspecified artificial knee joint: Secondary | ICD-10-CM | POA: Diagnosis present

## 2016-08-04 DIAGNOSIS — Z7984 Long term (current) use of oral hypoglycemic drugs: Secondary | ICD-10-CM | POA: Diagnosis not present

## 2016-08-04 DIAGNOSIS — Z886 Allergy status to analgesic agent status: Secondary | ICD-10-CM | POA: Diagnosis not present

## 2016-08-04 DIAGNOSIS — R140 Abdominal distension (gaseous): Secondary | ICD-10-CM | POA: Diagnosis not present

## 2016-08-04 DIAGNOSIS — K7689 Other specified diseases of liver: Secondary | ICD-10-CM | POA: Diagnosis not present

## 2016-08-04 DIAGNOSIS — R7989 Other specified abnormal findings of blood chemistry: Secondary | ICD-10-CM | POA: Diagnosis not present

## 2016-08-04 DIAGNOSIS — D72829 Elevated white blood cell count, unspecified: Secondary | ICD-10-CM | POA: Diagnosis not present

## 2016-08-04 DIAGNOSIS — D72828 Other elevated white blood cell count: Secondary | ICD-10-CM | POA: Diagnosis not present

## 2016-08-04 DIAGNOSIS — Z885 Allergy status to narcotic agent status: Secondary | ICD-10-CM | POA: Diagnosis not present

## 2016-08-04 DIAGNOSIS — Z6832 Body mass index (BMI) 32.0-32.9, adult: Secondary | ICD-10-CM | POA: Diagnosis not present

## 2016-08-04 DIAGNOSIS — D696 Thrombocytopenia, unspecified: Secondary | ICD-10-CM | POA: Diagnosis present

## 2016-08-04 DIAGNOSIS — R7881 Bacteremia: Secondary | ICD-10-CM | POA: Diagnosis not present

## 2016-08-04 DIAGNOSIS — R197 Diarrhea, unspecified: Secondary | ICD-10-CM | POA: Diagnosis not present

## 2016-08-04 DIAGNOSIS — K651 Peritoneal abscess: Secondary | ICD-10-CM | POA: Diagnosis not present

## 2016-08-04 DIAGNOSIS — R143 Flatulence: Secondary | ICD-10-CM | POA: Diagnosis not present

## 2016-08-04 DIAGNOSIS — R109 Unspecified abdominal pain: Secondary | ICD-10-CM | POA: Diagnosis not present

## 2016-08-04 DIAGNOSIS — K750 Abscess of liver: Secondary | ICD-10-CM | POA: Diagnosis not present

## 2016-08-04 DIAGNOSIS — Z452 Encounter for adjustment and management of vascular access device: Secondary | ICD-10-CM | POA: Diagnosis not present

## 2016-08-04 DIAGNOSIS — K8000 Calculus of gallbladder with acute cholecystitis without obstruction: Secondary | ICD-10-CM | POA: Diagnosis present

## 2016-08-04 DIAGNOSIS — R0602 Shortness of breath: Secondary | ICD-10-CM | POA: Diagnosis not present

## 2016-08-04 DIAGNOSIS — R7889 Finding of other specified substances, not normally found in blood: Secondary | ICD-10-CM | POA: Diagnosis not present

## 2016-08-04 DIAGNOSIS — K810 Acute cholecystitis: Secondary | ICD-10-CM | POA: Diagnosis not present

## 2016-08-04 DIAGNOSIS — Z794 Long term (current) use of insulin: Secondary | ICD-10-CM | POA: Diagnosis not present

## 2016-08-04 DIAGNOSIS — A419 Sepsis, unspecified organism: Secondary | ICD-10-CM | POA: Diagnosis not present

## 2016-08-04 DIAGNOSIS — J069 Acute upper respiratory infection, unspecified: Secondary | ICD-10-CM | POA: Diagnosis not present

## 2016-08-04 DIAGNOSIS — K8020 Calculus of gallbladder without cholecystitis without obstruction: Secondary | ICD-10-CM | POA: Diagnosis not present

## 2016-08-04 DIAGNOSIS — K828 Other specified diseases of gallbladder: Secondary | ICD-10-CM | POA: Diagnosis not present

## 2016-08-04 DIAGNOSIS — I10 Essential (primary) hypertension: Secondary | ICD-10-CM | POA: Diagnosis present

## 2016-08-16 DIAGNOSIS — K750 Abscess of liver: Secondary | ICD-10-CM | POA: Diagnosis not present

## 2016-08-16 DIAGNOSIS — E669 Obesity, unspecified: Secondary | ICD-10-CM | POA: Diagnosis not present

## 2016-08-16 DIAGNOSIS — E119 Type 2 diabetes mellitus without complications: Secondary | ICD-10-CM | POA: Diagnosis not present

## 2016-08-16 DIAGNOSIS — K567 Ileus, unspecified: Secondary | ICD-10-CM | POA: Diagnosis not present

## 2016-08-16 DIAGNOSIS — Z48815 Encounter for surgical aftercare following surgery on the digestive system: Secondary | ICD-10-CM | POA: Diagnosis not present

## 2016-08-16 DIAGNOSIS — N179 Acute kidney failure, unspecified: Secondary | ICD-10-CM | POA: Diagnosis not present

## 2016-08-20 DIAGNOSIS — E119 Type 2 diabetes mellitus without complications: Secondary | ICD-10-CM | POA: Diagnosis not present

## 2016-08-20 DIAGNOSIS — E669 Obesity, unspecified: Secondary | ICD-10-CM | POA: Diagnosis not present

## 2016-08-20 DIAGNOSIS — K567 Ileus, unspecified: Secondary | ICD-10-CM | POA: Diagnosis not present

## 2016-08-20 DIAGNOSIS — Z48815 Encounter for surgical aftercare following surgery on the digestive system: Secondary | ICD-10-CM | POA: Diagnosis not present

## 2016-08-20 DIAGNOSIS — A419 Sepsis, unspecified organism: Secondary | ICD-10-CM | POA: Diagnosis not present

## 2016-08-20 DIAGNOSIS — N179 Acute kidney failure, unspecified: Secondary | ICD-10-CM | POA: Diagnosis not present

## 2016-08-20 DIAGNOSIS — K750 Abscess of liver: Secondary | ICD-10-CM | POA: Diagnosis not present

## 2016-08-24 DIAGNOSIS — K7689 Other specified diseases of liver: Secondary | ICD-10-CM | POA: Diagnosis not present

## 2016-08-24 DIAGNOSIS — Z9049 Acquired absence of other specified parts of digestive tract: Secondary | ICD-10-CM | POA: Diagnosis not present

## 2016-08-24 DIAGNOSIS — K750 Abscess of liver: Secondary | ICD-10-CM | POA: Diagnosis not present

## 2016-08-25 DIAGNOSIS — E669 Obesity, unspecified: Secondary | ICD-10-CM | POA: Diagnosis not present

## 2016-08-25 DIAGNOSIS — Z48815 Encounter for surgical aftercare following surgery on the digestive system: Secondary | ICD-10-CM | POA: Diagnosis not present

## 2016-08-25 DIAGNOSIS — N179 Acute kidney failure, unspecified: Secondary | ICD-10-CM | POA: Diagnosis not present

## 2016-08-25 DIAGNOSIS — K567 Ileus, unspecified: Secondary | ICD-10-CM | POA: Diagnosis not present

## 2016-08-25 DIAGNOSIS — E119 Type 2 diabetes mellitus without complications: Secondary | ICD-10-CM | POA: Diagnosis not present

## 2016-08-25 DIAGNOSIS — K750 Abscess of liver: Secondary | ICD-10-CM | POA: Diagnosis not present

## 2016-08-27 DIAGNOSIS — N179 Acute kidney failure, unspecified: Secondary | ICD-10-CM | POA: Diagnosis not present

## 2016-08-27 DIAGNOSIS — K567 Ileus, unspecified: Secondary | ICD-10-CM | POA: Diagnosis not present

## 2016-08-27 DIAGNOSIS — E669 Obesity, unspecified: Secondary | ICD-10-CM | POA: Diagnosis not present

## 2016-08-27 DIAGNOSIS — Z48815 Encounter for surgical aftercare following surgery on the digestive system: Secondary | ICD-10-CM | POA: Diagnosis not present

## 2016-08-27 DIAGNOSIS — K750 Abscess of liver: Secondary | ICD-10-CM | POA: Diagnosis not present

## 2016-08-27 DIAGNOSIS — E119 Type 2 diabetes mellitus without complications: Secondary | ICD-10-CM | POA: Diagnosis not present

## 2016-08-27 DIAGNOSIS — A419 Sepsis, unspecified organism: Secondary | ICD-10-CM | POA: Diagnosis not present

## 2016-08-31 DIAGNOSIS — A419 Sepsis, unspecified organism: Secondary | ICD-10-CM | POA: Diagnosis not present

## 2016-08-31 DIAGNOSIS — K810 Acute cholecystitis: Secondary | ICD-10-CM | POA: Diagnosis not present

## 2016-08-31 DIAGNOSIS — K750 Abscess of liver: Secondary | ICD-10-CM | POA: Diagnosis not present

## 2016-09-03 DIAGNOSIS — K750 Abscess of liver: Secondary | ICD-10-CM | POA: Diagnosis not present

## 2016-09-03 DIAGNOSIS — Z48815 Encounter for surgical aftercare following surgery on the digestive system: Secondary | ICD-10-CM | POA: Diagnosis not present

## 2016-09-03 DIAGNOSIS — E669 Obesity, unspecified: Secondary | ICD-10-CM | POA: Diagnosis not present

## 2016-09-03 DIAGNOSIS — E119 Type 2 diabetes mellitus without complications: Secondary | ICD-10-CM | POA: Diagnosis not present

## 2016-09-03 DIAGNOSIS — K567 Ileus, unspecified: Secondary | ICD-10-CM | POA: Diagnosis not present

## 2016-09-03 DIAGNOSIS — N179 Acute kidney failure, unspecified: Secondary | ICD-10-CM | POA: Diagnosis not present

## 2016-09-11 ENCOUNTER — Encounter: Payer: Self-pay | Admitting: Family Medicine

## 2016-09-11 ENCOUNTER — Ambulatory Visit (INDEPENDENT_AMBULATORY_CARE_PROVIDER_SITE_OTHER): Payer: Medicare Other | Admitting: Family Medicine

## 2016-09-11 VITALS — BP 120/80 | HR 98 | Temp 98.1°F | Resp 16 | Wt 211.6 lb

## 2016-09-11 DIAGNOSIS — A408 Other streptococcal sepsis: Secondary | ICD-10-CM

## 2016-09-11 NOTE — Progress Notes (Signed)
Subjective:     Patient ID: Paul Hughes, male   DOB: 1946-04-11, 71 y.o.   MRN: [id number]  HPI  Chief Complaint  Patient presents with  . Routine Post Op    Patient comes in office today for post op visit after having gallbladder removed on 08/10/16 due to sepsis. Patient reports today he is feeling well and is still currently on antibiotic.   Completing two week course of Augmentin. Was found to have atypical Strep organism on culture and further I.D.follow up was to be considered (personal communication pending receipt of discharge summary). Had normal colonoscopy in 2016. I.D. Consultant: Veva Holes    [address] [place], [place]. [phone]. Phone: [phone].   Review of Systems     Objective:   Physical Exam  Constitutional: He appears well-developed and well-nourished. No distress.  Abdominal: Soft. There is no tenderness.  RUQ incision healing well without erythema or dehiscence.       Assessment:    1. Other streptococcal sepsis [facility]): resolved    Plan:    Will acquire discharge summary prior to possible I.D. Referral.

## 2016-09-11 NOTE — Patient Instructions (Signed)
We will have you sign a release to get your discharge summary from [facility] in [place]. Once I receive it I will call you about whether a further referral to infectious disease is necessary.

## 2016-09-14 ENCOUNTER — Encounter: Payer: Self-pay | Admitting: Family Medicine

## 2016-09-17 ENCOUNTER — Encounter: Payer: Self-pay | Admitting: Family Medicine

## 2016-09-18 ENCOUNTER — Other Ambulatory Visit: Payer: Self-pay | Admitting: Family Medicine

## 2016-09-18 DIAGNOSIS — K750 Abscess of liver: Secondary | ICD-10-CM

## 2016-09-19 ENCOUNTER — Telehealth: Payer: Self-pay

## 2016-09-19 ENCOUNTER — Other Ambulatory Visit
Admission: RE | Admit: 2016-09-19 | Discharge: 2016-09-19 | Disposition: A | Discharge status: Home / Self Care | Payer: Medicare Other | Source: Ambulatory Visit | Attending: Family Medicine | Admitting: Family Medicine

## 2016-09-19 ENCOUNTER — Other Ambulatory Visit: Payer: Self-pay | Admitting: Family Medicine

## 2016-09-19 DIAGNOSIS — E1159 Type 2 diabetes mellitus with other circulatory complications: Secondary | ICD-10-CM | POA: Diagnosis not present

## 2016-09-19 DIAGNOSIS — I10 Essential (primary) hypertension: Secondary | ICD-10-CM | POA: Insufficient documentation

## 2016-09-19 DIAGNOSIS — I152 Hypertension secondary to endocrine disorders: Secondary | ICD-10-CM

## 2016-09-19 LAB — RENAL FUNCTION PANEL
Albumin: 3.9 g/dL (ref 3.5–5.0)
Anion gap: 9 (ref 5–15)
BUN: 12 mg/dL (ref 6–20)
CHLORIDE: 103 mmol/L (ref 101–111)
CO2: 23 mmol/L (ref 22–32)
CREATININE: 1.02 mg/dL (ref 0.61–1.24)
Calcium: 9 mg/dL (ref 8.9–10.3)
GFR calc Af Amer: >60 mL/min (ref >60)
GFR calc non Af Amer: >60 mL/min (ref >60)
Glucose, Bld: 180 mg/dL — ABNORMAL HIGH (ref 65–99)
Phosphorus: 2.8 mg/dL (ref 2.5–4.6)
Potassium: 3.8 mmol/L (ref 3.5–5.1)
Sodium: 135 mmol/L (ref 135–145)

## 2016-09-19 NOTE — Telephone Encounter (Signed)
Angel from [facility] states that she sees order for patient to have upcoming CT scan but she did not see a recent Creatinine level in the past 6 months. Glenard Haring is requesting that we put order in Epic to patient can have lab drawn. KW

## 2016-09-19 NOTE — Telephone Encounter (Signed)
Patient advised as below.  

## 2016-09-19 NOTE — Telephone Encounter (Signed)
-----   Message from Carmon Ginsberg, [place] sent at 09/19/2016 12:18 PM EDT ----- Kidney function good.

## 2016-09-19 NOTE — Telephone Encounter (Signed)
Paul Hughes called back office after speaking with patient and he stated that he had given you all his documentation from the hospital including labs and c.d of CT. I informed Paul Hughes that a copy of CT report is already scanned in chart and has been reviewed she states that would not be sufficent enough and they need you to mail them cd of CT. Angel Request that you mail it to her.  Attn: [facility] [address] [place], [place] [address]

## 2016-09-19 NOTE — Telephone Encounter (Signed)
done

## 2016-09-21 ENCOUNTER — Other Ambulatory Visit: Payer: Self-pay | Admitting: Family Medicine

## 2016-09-21 ENCOUNTER — Ambulatory Visit
Admission: RE | Admit: 2016-09-21 | Discharge: 2016-09-21 | Disposition: A | Discharge status: Home / Self Care | Payer: Medicare Other | Source: Ambulatory Visit | Attending: Family Medicine | Admitting: Family Medicine

## 2016-09-21 DIAGNOSIS — K750 Abscess of liver: Secondary | ICD-10-CM

## 2016-09-21 DIAGNOSIS — I2510 Atherosclerotic heart disease of native coronary artery without angina pectoris: Secondary | ICD-10-CM | POA: Diagnosis not present

## 2016-09-21 DIAGNOSIS — Z9049 Acquired absence of other specified parts of digestive tract: Secondary | ICD-10-CM | POA: Diagnosis not present

## 2016-09-21 MED ORDER — IOPAMIDOL (ISOVUE-300) INJECTION 61%
125.0000 mL | Freq: Once | INTRAVENOUS | Status: AC | PRN
  Administered 2016-09-21: 125 mL via INTRAVENOUS

## 2016-09-26 ENCOUNTER — Telehealth: Payer: Self-pay | Admitting: Family Medicine

## 2016-09-26 NOTE — Telephone Encounter (Signed)
RIO faxed to [employer]. On 09-11-16

## 2016-09-26 NOTE — Telephone Encounter (Signed)
Received records from [employer]. On 09-14-16 3 pages, Mariel Sleet P.A.

## 2016-10-04 ENCOUNTER — Encounter: Payer: Self-pay | Admitting: Family Medicine

## 2016-10-05 DIAGNOSIS — K810 Acute cholecystitis: Secondary | ICD-10-CM | POA: Diagnosis not present

## 2016-10-05 DIAGNOSIS — K750 Abscess of liver: Secondary | ICD-10-CM | POA: Diagnosis not present

## 2016-10-19 DIAGNOSIS — K830 Cholangitis: Secondary | ICD-10-CM | POA: Diagnosis not present

## 2016-10-19 DIAGNOSIS — K750 Abscess of liver: Secondary | ICD-10-CM | POA: Diagnosis not present

## 2016-11-19 DIAGNOSIS — H401134 Primary open-angle glaucoma, bilateral, indeterminate stage: Secondary | ICD-10-CM | POA: Diagnosis not present

## 2016-12-17 DIAGNOSIS — E1165 Type 2 diabetes mellitus with hyperglycemia: Secondary | ICD-10-CM | POA: Diagnosis not present

## 2017-01-16 DIAGNOSIS — Z23 Encounter for immunization: Secondary | ICD-10-CM | POA: Diagnosis not present

## 2017-01-21 DIAGNOSIS — Z85820 Personal history of malignant melanoma of skin: Secondary | ICD-10-CM | POA: Diagnosis not present

## 2017-01-21 DIAGNOSIS — Z86018 Personal history of other benign neoplasm: Secondary | ICD-10-CM | POA: Diagnosis not present

## 2017-01-21 DIAGNOSIS — Z872 Personal history of diseases of the skin and subcutaneous tissue: Secondary | ICD-10-CM | POA: Diagnosis not present

## 2017-01-21 DIAGNOSIS — L578 Other skin changes due to chronic exposure to nonionizing radiation: Secondary | ICD-10-CM | POA: Diagnosis not present

## 2017-01-21 DIAGNOSIS — L570 Actinic keratosis: Secondary | ICD-10-CM | POA: Diagnosis not present

## 2017-02-21 ENCOUNTER — Telehealth: Payer: Self-pay | Admitting: Family Medicine

## 2017-02-21 ENCOUNTER — Other Ambulatory Visit: Payer: Self-pay | Admitting: Family Medicine

## 2017-02-21 DIAGNOSIS — B3742 Candidal balanitis: Secondary | ICD-10-CM

## 2017-02-21 MED ORDER — NYSTATIN 100000 UNIT/GM EX CREA: 1.0000 "application " | TOPICAL_CREAM | Freq: Three times a day (TID) | CUTANEOUS | 1 refills | Status: DC

## 2017-02-21 NOTE — Telephone Encounter (Signed)
Spoke with patient on phone who states that rash has been near his genitals intermittent for the past 3-4 months and is out of nystatin and wanted to know if we could refill today? KW

## 2017-02-21 NOTE — Telephone Encounter (Signed)
Pt wanting [facility] cream [facility] [place]

## 2017-02-21 NOTE — Telephone Encounter (Signed)
Medication refilled

## 2017-05-27 DIAGNOSIS — Z794 Long term (current) use of insulin: Secondary | ICD-10-CM | POA: Diagnosis not present

## 2017-05-27 DIAGNOSIS — E1165 Type 2 diabetes mellitus with hyperglycemia: Secondary | ICD-10-CM | POA: Diagnosis not present

## 2017-05-27 DIAGNOSIS — E1169 Type 2 diabetes mellitus with other specified complication: Secondary | ICD-10-CM | POA: Diagnosis not present

## 2017-05-28 DIAGNOSIS — H401132 Primary open-angle glaucoma, bilateral, moderate stage: Secondary | ICD-10-CM | POA: Diagnosis not present

## 2017-06-19 ENCOUNTER — Telehealth: Payer: Self-pay | Admitting: Family Medicine

## 2017-06-19 NOTE — Telephone Encounter (Signed)
Patient needs refill of [facility] Cream to [facility] [place]

## 2017-06-20 ENCOUNTER — Other Ambulatory Visit: Payer: Self-pay | Admitting: Family Medicine

## 2017-06-20 DIAGNOSIS — B3742 Candidal balanitis: Secondary | ICD-10-CM

## 2017-06-20 MED ORDER — NYSTATIN 100000 UNIT/GM EX CREA: 1.0000 "application " | TOPICAL_CREAM | Freq: Three times a day (TID) | CUTANEOUS | 1 refills | Status: DC

## 2017-06-20 NOTE — Telephone Encounter (Signed)
Done

## 2017-07-09 DIAGNOSIS — H401132 Primary open-angle glaucoma, bilateral, moderate stage: Secondary | ICD-10-CM | POA: Diagnosis not present

## 2017-07-23 DIAGNOSIS — Z872 Personal history of diseases of the skin and subcutaneous tissue: Secondary | ICD-10-CM | POA: Diagnosis not present

## 2017-07-23 DIAGNOSIS — Z85820 Personal history of malignant melanoma of skin: Secondary | ICD-10-CM | POA: Diagnosis not present

## 2017-07-23 DIAGNOSIS — L578 Other skin changes due to chronic exposure to nonionizing radiation: Secondary | ICD-10-CM | POA: Diagnosis not present

## 2017-07-23 DIAGNOSIS — Z86018 Personal history of other benign neoplasm: Secondary | ICD-10-CM | POA: Diagnosis not present

## 2017-08-16 ENCOUNTER — Other Ambulatory Visit: Payer: Self-pay | Admitting: Family Medicine

## 2017-08-16 ENCOUNTER — Telehealth: Payer: Self-pay | Admitting: Family Medicine

## 2017-08-16 DIAGNOSIS — B3742 Candidal balanitis: Secondary | ICD-10-CM

## 2017-08-16 MED ORDER — NYSTATIN 100000 UNIT/GM EX CREA: 1.0000 "application " | TOPICAL_CREAM | Freq: Three times a day (TID) | CUTANEOUS | 1 refills | Status: DC

## 2017-08-16 NOTE — Telephone Encounter (Signed)
Pt contacted office for refill request on the following medications:  nystatin cream (MYCOSTATIN)  [facility] [place]  Last Rx: 06/20/17 with 1 refill Pt stated that [facility]. Please advise. Thanks TNP

## 2017-08-19 ENCOUNTER — Other Ambulatory Visit: Payer: Self-pay | Admitting: Family Medicine

## 2017-08-19 DIAGNOSIS — B3742 Candidal balanitis: Secondary | ICD-10-CM

## 2017-08-19 NOTE — Telephone Encounter (Signed)
Please advise. Thanks.  

## 2017-08-19 NOTE — Telephone Encounter (Signed)
Pt needs refill on his nystatin cream  [facility] [place]  thanks teri

## 2017-11-05 ENCOUNTER — Telehealth: Payer: Self-pay | Admitting: Family Medicine

## 2017-11-05 ENCOUNTER — Other Ambulatory Visit: Payer: Self-pay | Admitting: Family Medicine

## 2017-11-05 DIAGNOSIS — B3742 Candidal balanitis: Secondary | ICD-10-CM

## 2017-11-05 MED ORDER — NYSTATIN 100000 UNIT/GM EX CREA: 1.0000 "application " | TOPICAL_CREAM | Freq: Two times a day (BID) | CUTANEOUS | 1 refills | Status: DC

## 2017-11-05 NOTE — Telephone Encounter (Signed)
Pt needs a refill on his nystatin cream  [facility]  [place]  CB# [phone]  Thanks Con [employer]

## 2017-11-05 NOTE — Telephone Encounter (Signed)
Last filled 08/16/17 with 1 refill. KW

## 2017-11-05 NOTE — Telephone Encounter (Signed)
See below

## 2017-11-05 NOTE — Telephone Encounter (Signed)
done

## 2017-12-11 DIAGNOSIS — H401132 Primary open-angle glaucoma, bilateral, moderate stage: Secondary | ICD-10-CM | POA: Diagnosis not present

## 2018-01-08 DIAGNOSIS — Z23 Encounter for immunization: Secondary | ICD-10-CM | POA: Diagnosis not present

## 2018-01-20 DIAGNOSIS — E1169 Type 2 diabetes mellitus with other specified complication: Secondary | ICD-10-CM | POA: Diagnosis not present

## 2018-01-20 DIAGNOSIS — Z794 Long term (current) use of insulin: Secondary | ICD-10-CM | POA: Diagnosis not present

## 2018-01-20 LAB — HEMOGLOBIN A1C: Hemoglobin A1C: 9.4

## 2018-03-10 DIAGNOSIS — Z872 Personal history of diseases of the skin and subcutaneous tissue: Secondary | ICD-10-CM | POA: Diagnosis not present

## 2018-03-10 DIAGNOSIS — L570 Actinic keratosis: Secondary | ICD-10-CM | POA: Diagnosis not present

## 2018-03-10 DIAGNOSIS — Z86018 Personal history of other benign neoplasm: Secondary | ICD-10-CM | POA: Diagnosis not present

## 2018-03-10 DIAGNOSIS — Z85820 Personal history of malignant melanoma of skin: Secondary | ICD-10-CM | POA: Diagnosis not present

## 2018-03-10 DIAGNOSIS — L578 Other skin changes due to chronic exposure to nonionizing radiation: Secondary | ICD-10-CM | POA: Diagnosis not present

## 2018-03-11 ENCOUNTER — Other Ambulatory Visit: Payer: Self-pay | Admitting: Family Medicine

## 2018-03-11 DIAGNOSIS — B3742 Candidal balanitis: Secondary | ICD-10-CM

## 2018-03-19 ENCOUNTER — Other Ambulatory Visit: Payer: Self-pay

## 2018-06-16 DIAGNOSIS — H401132 Primary open-angle glaucoma, bilateral, moderate stage: Secondary | ICD-10-CM | POA: Diagnosis not present

## 2018-06-16 LAB — HM DIABETES EYE EXAM

## 2018-06-23 ENCOUNTER — Other Ambulatory Visit: Payer: Self-pay | Admitting: Family Medicine

## 2018-06-23 DIAGNOSIS — B3742 Candidal balanitis: Secondary | ICD-10-CM

## 2018-06-23 NOTE — Telephone Encounter (Signed)
Please review. KW 

## 2018-06-23 NOTE — Telephone Encounter (Signed)
Pt needing refill cream.   [id number] [place] [place] [phone] (Phone) [phone] (Fax)   Thanks, [employer]

## 2018-09-04 ENCOUNTER — Telehealth: Payer: Self-pay | Admitting: Physician Assistant

## 2018-09-04 DIAGNOSIS — B3742 Candidal balanitis: Secondary | ICD-10-CM

## 2018-09-04 MED ORDER — NYSTATIN 100000 UNIT/GM EX CREA: 1.0000 "application " | TOPICAL_CREAM | Freq: Two times a day (BID) | CUTANEOUS | 1 refills | Status: DC

## 2018-09-04 NOTE — Telephone Encounter (Signed)
Prescription filled. KW

## 2018-09-04 NOTE — Telephone Encounter (Signed)
[facility] for [facility] following medications:  nystatin cream (MYCOSTATIN)  Please advise.

## 2018-09-04 NOTE — Telephone Encounter (Signed)
Called pharmacist to cancel prescription, contacted patient to schedule virtual visit but he claims he does not have access to internet nor has a email address. Appointment booked at 9:40 tomorrow to see you. KW

## 2018-09-04 NOTE — Telephone Encounter (Signed)
Can you call and cancel this prescription? He has not been seen in clinic for two years. Needs appointment.

## 2018-09-05 ENCOUNTER — Ambulatory Visit (INDEPENDENT_AMBULATORY_CARE_PROVIDER_SITE_OTHER): Payer: Medicare Other | Admitting: Physician Assistant

## 2018-09-05 ENCOUNTER — Encounter: Payer: Self-pay | Admitting: Physician Assistant

## 2018-09-05 VITALS — Wt 205.0 lb

## 2018-09-05 DIAGNOSIS — I10 Essential (primary) hypertension: Secondary | ICD-10-CM | POA: Diagnosis not present

## 2018-09-05 DIAGNOSIS — B3742 Candidal balanitis: Secondary | ICD-10-CM | POA: Diagnosis not present

## 2018-09-05 DIAGNOSIS — E119 Type 2 diabetes mellitus without complications: Secondary | ICD-10-CM | POA: Diagnosis not present

## 2018-09-05 DIAGNOSIS — Z794 Long term (current) use of insulin: Secondary | ICD-10-CM | POA: Diagnosis not present

## 2018-09-05 MED ORDER — NYSTATIN 100000 UNIT/GM EX CREA: 1.0000 "application " | TOPICAL_CREAM | Freq: Two times a day (BID) | CUTANEOUS | 1 refills | Status: DC

## 2018-09-05 NOTE — Progress Notes (Signed)
Patient: Paul Hughes Male    DOB: 1945-12-18   73 y.o.   MRN: [id number] Visit Date: 09/05/2018  Today's Provider: Trinna Post, PA-C   Chief Complaint  Patient presents with  . Medication Refill   Subjective:    Virtual Visit via Video Note  I connected with Paul Hughes on 09/05/18 at  9:40 AM EDT by a video enabled telemedicine application and verified that I am speaking with the correct person using two identifiers.   I discussed the limitations of evaluation and management by telemedicine and the availability of in person appointments. The patient expressed understanding and agreed to proceed.  Patient location: home Provider location: [place] office  Persons involved in the visit: patient, provider   Interactive audio and video communications were attempted, although failed due to patient's inability to connect to video. Continued visit with audio only interaction with patient agreement.  HPI   Previously saw Mariel Sleet, PA-C who has since retirued.  Previously worked at [employer] and [employer], Science writer for.   Patient presents today with concerns of irritation near his genital region. Patients has previously been treated with Nystatin in past with success and is requesting a refill today.   Type II Diabetes: Sees Dr. Jerl Mina   Taking metformin 1000 mg BID, Lisinopril 10-12.5mg  once daily, Lipitor 10 mg daily, Humulin 70/30 twenty units twice daily before meals.   Diabetic eye exam: 3 months ago, patient reports no diabetic retinopathy. Being monitored for glaucoma. Dr. Hillery Hunter at [facility] in [place]. History of detached retina.  Allergies  Allergen Reactions  . Sulfa Antibiotics Shortness Of Breath  . Codeine      Current Outpatient Medications:  .  aspirin 81 MG tablet, Take 81 mg by mouth daily., Disp: , Rfl:  .  atorvastatin (LIPITOR) 10 MG tablet, Take 10 mg by mouth daily.,  Disp: , Rfl:  .  BD PEN NEEDLE NANO U/F 32G X 4 MM MISC, USE 3-4 TIMES DAILY TO INJECT INSULIN UNDER THE SKIN, Disp: , Rfl: 3 .  brimonidine (ALPHAGAN) 0.15 % ophthalmic solution, 1 drop 3 (three) times daily., Disp: , Rfl:  .  brimonidine (ALPHAGAN) 0.2 % ophthalmic solution, INSTILL 1 DROP INTO BOTH EYES TWICE A DAY, Disp: , Rfl:  .  glucose blood (PRECISION QID TEST) test strip, Use as instructed. Contour next blood test strips tests 5x daily, Disp: , Rfl:  .  HUMULIN 70/30 KWIKPEN (70-30) 100 UNIT/ML PEN, INJECT 20 UNITS TWICE DAILY BEFORE MEALS, Disp: , Rfl: 5 .  lansoprazole (PREVACID) 15 MG capsule, Take 15 mg by mouth daily at 12 noon., Disp: , Rfl:  .  latanoprost (XALATAN) 0.005 % ophthalmic solution, INSTILL 1 DROP INTO BOTH EYES EVERY DAY IN THE EVENING, Disp: , Rfl:  .  lisinopril-hydrochlorothiazide (PRINZIDE,ZESTORETIC) 10-12.5 MG per tablet, Take 1 tablet by mouth daily., Disp: , Rfl:  .  metFORMIN (GLUCOPHAGE) 1000 MG tablet, Take 1,000 mg by mouth 2 (two) times daily with a meal., Disp: , Rfl:  .  nystatin cream (MYCOSTATIN), Apply 1 application topically 2 (two) times daily. To foreskin/penis, Disp: 30 g, Rfl: 1  Review of Systems  Constitutional: Negative.   HENT: Negative.   Respiratory: Negative.   Gastrointestinal: Negative.   Genitourinary: Negative.     Social History   Tobacco Use  . Smoking status: Never Smoker  . Smokeless tobacco: Never Used  Substance Use Topics  .  Alcohol use: No    Alcohol/week: 0.0 standard drinks      Objective:   Wt 205 lb (93 kg)   BMI 30.27 kg/m  Vitals:   09/05/18 0852  Weight: 205 lb (93 kg)     Physical Exam      Assessment & Plan    1. Type 2 diabetes mellitus without complication, with long-term current use of insulin ([place])  Sees [facility] for this. Takes statin and ACEi.   2. Essential hypertension  Well controlled on last visit.   3. Candidal balanitis  - nystatin cream (MYCOSTATIN); Apply 1  application topically 2 (two) times daily. To foreskin/penis  Dispense: 30 g; Refill: 1  The entirety of the information documented in the History of Present Illness, Review of Systems and Physical Exam were personally obtained by me. Portions of this information were initially documented by Jennings Books, CMA and reviewed by me for thoroughness and accuracy.      Trinna Post, PA-C  [facility] Fritzi Mandes Maceo as a scribe for Trinna Post, PA-C.,have documented all relevant documentation on the behalf of Trinna Post, PA-C,as directed by  Trinna Post, PA-C while in the presence of Trinna Post, PA-C.

## 2018-09-05 NOTE — Patient Instructions (Signed)
Diabetes Mellitus and Exercise Exercising regularly is important for your overall health, especially when you have diabetes (diabetes mellitus). Exercising is not only about losing weight. It has many other health benefits, such as increasing muscle strength and bone density and reducing body fat and stress. This leads to improved fitness, flexibility, and endurance, all of which result in better overall health. Exercise has additional benefits for people with diabetes, including:  Reducing appetite.  Helping to lower and control blood glucose.  Lowering blood pressure.  Helping to control amounts of fatty substances (lipids) in the blood, such as cholesterol and triglycerides.  Helping the body to respond better to insulin (improving insulin sensitivity).  Reducing how much insulin the body needs.  Decreasing the risk for heart disease by: ? Lowering cholesterol and triglyceride levels. ? Increasing the levels of good cholesterol. ? Lowering blood glucose levels. What is my activity plan? Your health care provider or certified diabetes educator can help you make a plan for the type and frequency of exercise (activity plan) that works for you. Make sure that you:  Do at least 150 minutes of moderate-intensity or vigorous-intensity exercise each week. This could be brisk walking, biking, or water aerobics. ? Do stretching and strength exercises, such as yoga or weightlifting, at least 2 times a week. ? Spread out your activity over at least 3 days of the week.  Get some form of physical activity every day. ? Do not go more than 2 days in a row without some kind of physical activity. ? Avoid being inactive for more than 30 minutes at a time. Take frequent breaks to walk or stretch.  Choose a type of exercise or activity that you enjoy, and set realistic goals.  Start slowly, and gradually increase the intensity of your exercise over time. What do I need to know about managing my  diabetes?   Check your blood glucose before and after exercising. ? If your blood glucose is 240 mg/dL (13.3 mmol/L) or higher before you exercise, check your urine for ketones. If you have ketones in your urine, do not exercise until your blood glucose returns to normal. ? If your blood glucose is 100 mg/dL (5.6 mmol/L) or lower, eat a snack containing 15-20 grams of carbohydrate. Check your blood glucose 15 minutes after the snack to make sure that your level is above 100 mg/dL (5.6 mmol/L) before you start your exercise.  Know the symptoms of low blood glucose (hypoglycemia) and how to treat it. Your risk for hypoglycemia increases during and after exercise. Common symptoms of hypoglycemia can include: ? Hunger. ? Anxiety. ? Sweating and feeling clammy. ? Confusion. ? Dizziness or feeling light-headed. ? Increased heart rate or palpitations. ? Blurry vision. ? Tingling or numbness around the mouth, lips, or tongue. ? Tremors or shakes. ? Irritability.  Keep a rapid-acting carbohydrate snack available before, during, and after exercise to help prevent or treat hypoglycemia.  Avoid injecting insulin into areas of the body that are going to be exercised. For example, avoid injecting insulin into: ? The arms, when playing tennis. ? The legs, when jogging.  Keep records of your exercise habits. Doing this can help you and your health care provider adjust your diabetes management plan as needed. Write down: ? Food that you eat before and after you exercise. ? Blood glucose levels before and after you exercise. ? The type and amount of exercise you have done. ? When your insulin is expected to peak, if you use   insulin. Avoid exercising at times when your insulin is peaking.  When you start a new exercise or activity, work with your health care provider to make sure the activity is safe for you, and to adjust your insulin, medicines, or food intake as needed.  Drink plenty of water while  you exercise to prevent dehydration or heat stroke. Drink enough fluid to keep your urine clear or pale yellow. Summary  Exercising regularly is important for your overall health, especially when you have diabetes (diabetes mellitus).  Exercising has many health benefits, such as increasing muscle strength and bone density and reducing body fat and stress.  Your health care provider or certified diabetes educator can help you make a plan for the type and frequency of exercise (activity plan) that works for you.  When you start a new exercise or activity, work with your health care provider to make sure the activity is safe for you, and to adjust your insulin, medicines, or food intake as needed. This information is not intended to replace advice given to you by your health care provider. Make sure you discuss any questions you have with your health care provider. Document Released: 07/07/2003 Document Revised: 10/25/2016 Document Reviewed: 09/26/2015 Elsevier Interactive Patient Education  2019 Elsevier Inc.  

## 2018-09-08 DIAGNOSIS — E1169 Type 2 diabetes mellitus with other specified complication: Secondary | ICD-10-CM | POA: Diagnosis not present

## 2018-09-08 DIAGNOSIS — Z794 Long term (current) use of insulin: Secondary | ICD-10-CM | POA: Diagnosis not present

## 2018-09-10 DIAGNOSIS — Z794 Long term (current) use of insulin: Secondary | ICD-10-CM | POA: Diagnosis not present

## 2018-09-10 DIAGNOSIS — E1169 Type 2 diabetes mellitus with other specified complication: Secondary | ICD-10-CM | POA: Diagnosis not present

## 2018-09-19 NOTE — Progress Notes (Signed)
A1C abstracted. ROI send to [facility]

## 2018-09-29 IMAGING — CT CT ABDOMEN W/ CM
2 of 5 series · 16 of 46 positions shown, 18 images · IV contrast (iopamidol)
Comparison: Prior CT scans are unavailable for comparison.

CLINICAL DATA: .Status post cholecystectomy with subsequent liver
abscesses. Followup examination.

EXAM:
CT ABDOMEN WITH CONTRAST
TECHNIQUE: Multidetector CT imaging of the abdomen was performed using the
standard protocol following bolus administration of intravenous
contrast.
CONTRAST:  125mL [id number] IOPAMIDOL ([id number]) INJECTION 61%

[Series 2: axial soft tissue · axial · 0.90mm/px · z∈[-580,-285]mm · 13 of 67 slices shown, 15 images]
[im 4/67  soft-tissue]
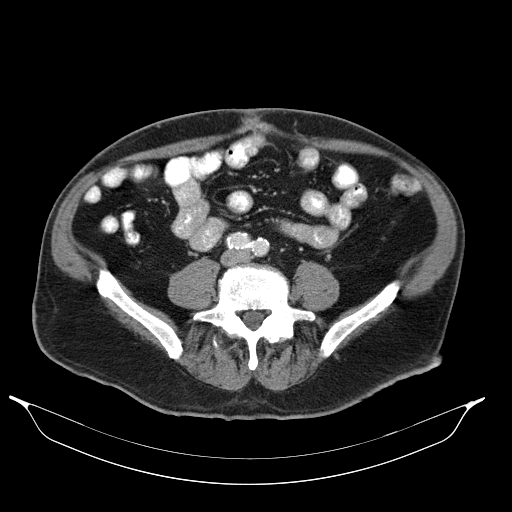
[im 4/67  bone]
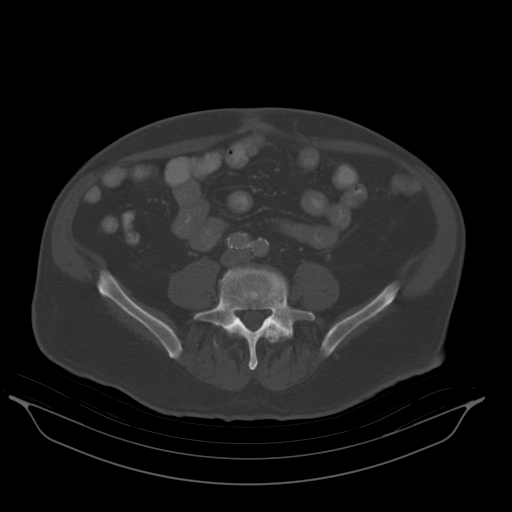
[im 8/67  soft-tissue]
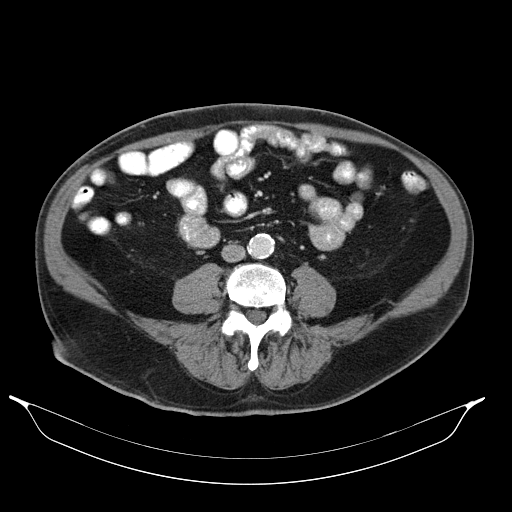
[im 16/67  soft-tissue]
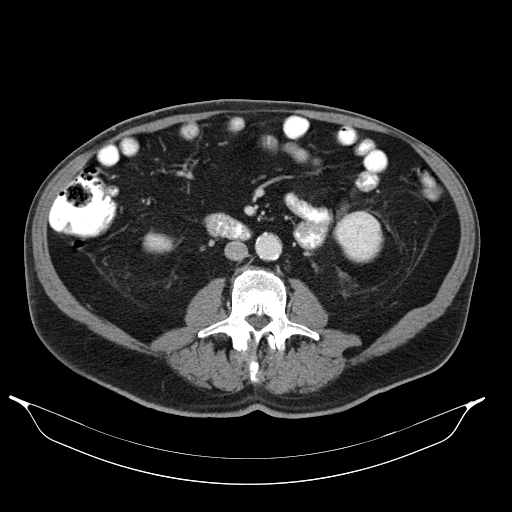
[im 20/67  soft-tissue]
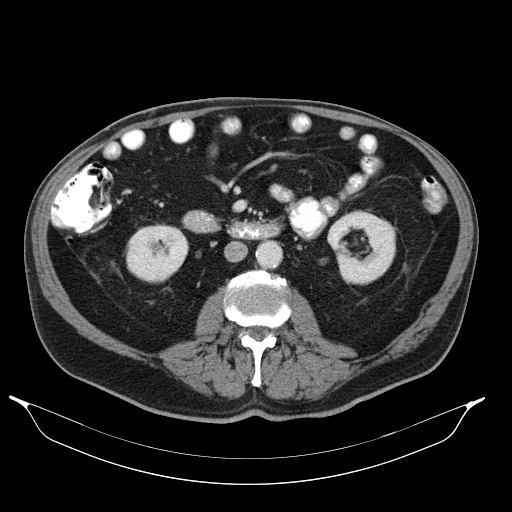
[im 24/67  soft-tissue]
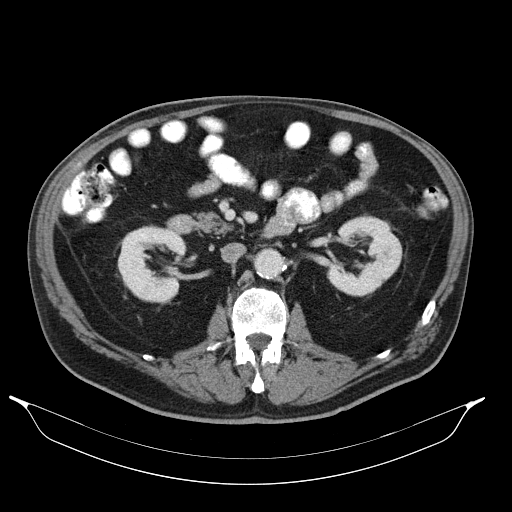
[im 28/67  soft-tissue]
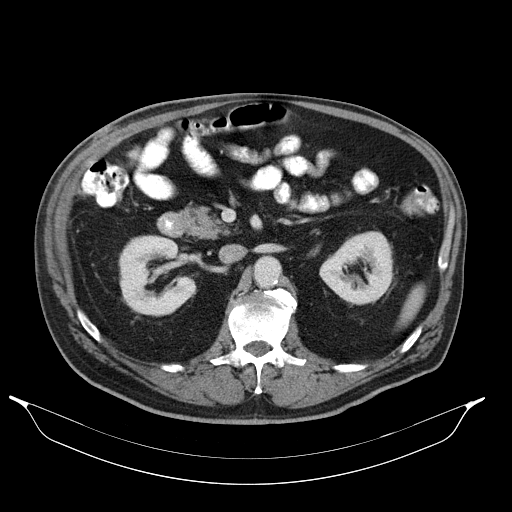
[im 35/67  soft-tissue]
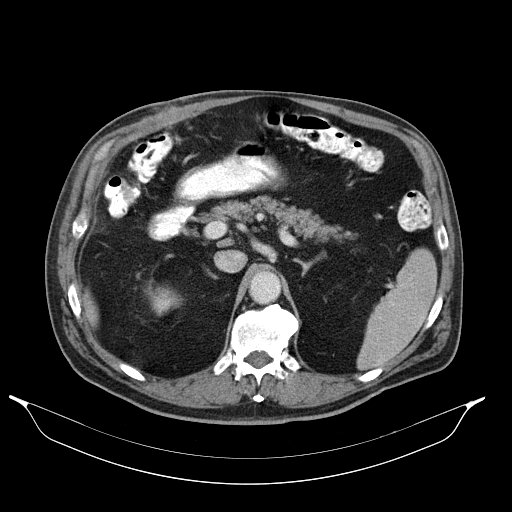
[im 39/67  soft-tissue]
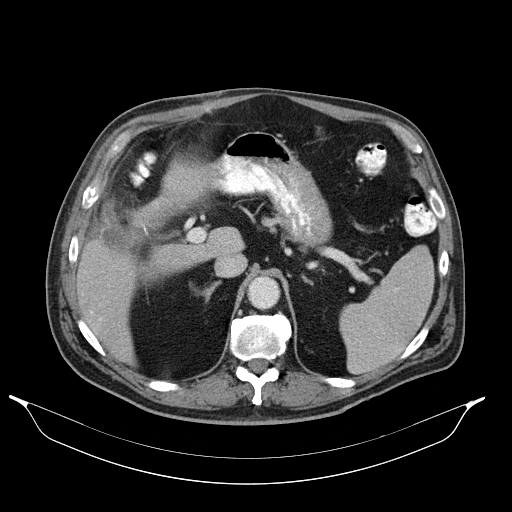
[im 43/67  soft-tissue]
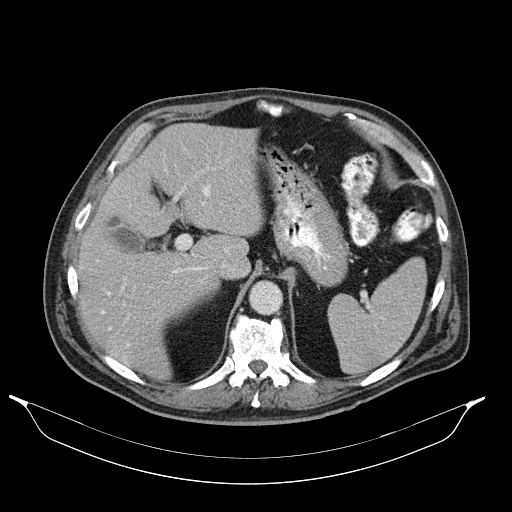
[im 43/67  bone]
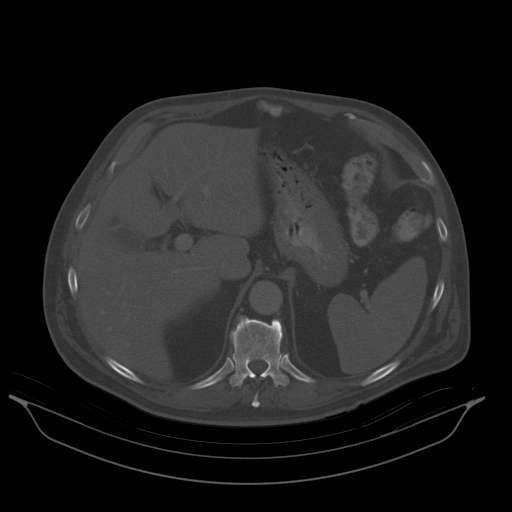
[im 47/67  soft-tissue]
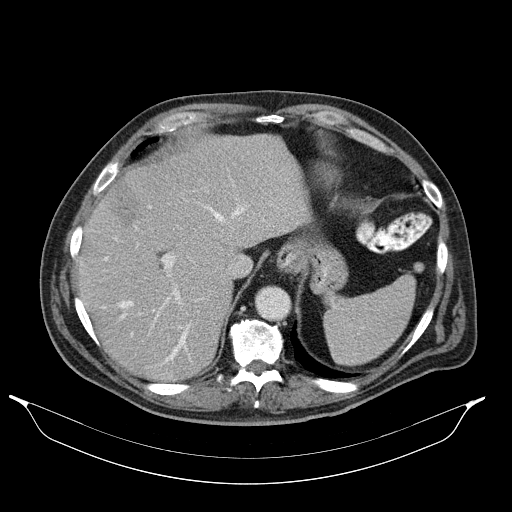
[im 51/67  soft-tissue]
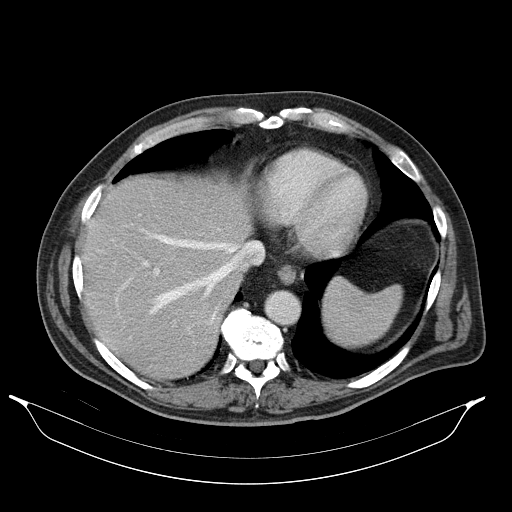
[im 59/67  soft-tissue]
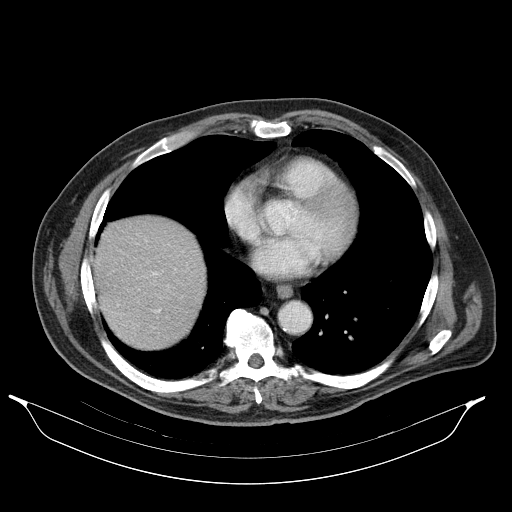
[im 63/67  soft-tissue]
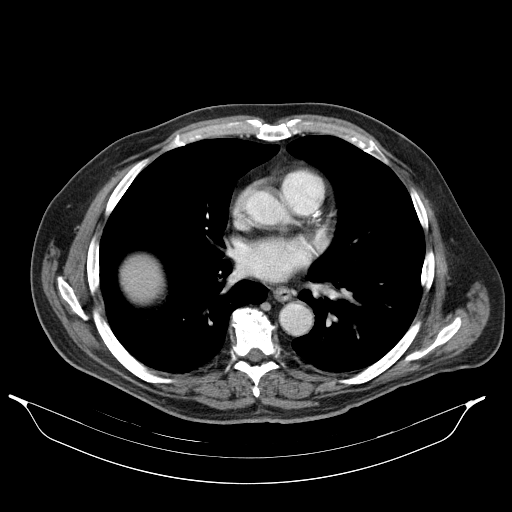

[Series 602: coronal · coronal · 0.90mm/px · 3 of 100 slices shown]
[im 34/100  soft-tissue]
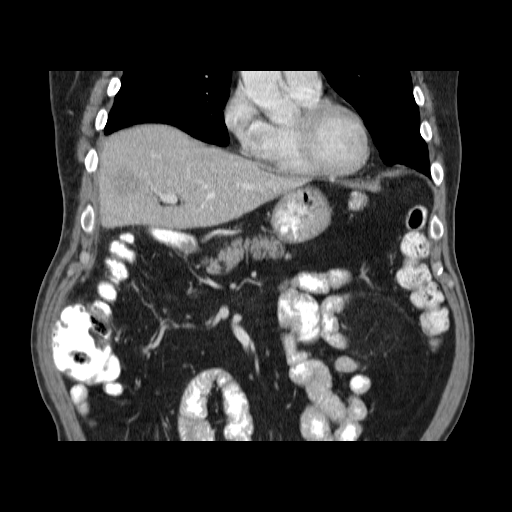
[im 45/100  soft-tissue]
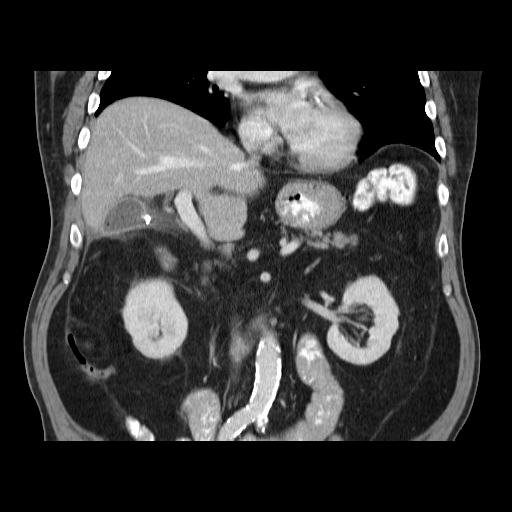
[im 56/100  soft-tissue]
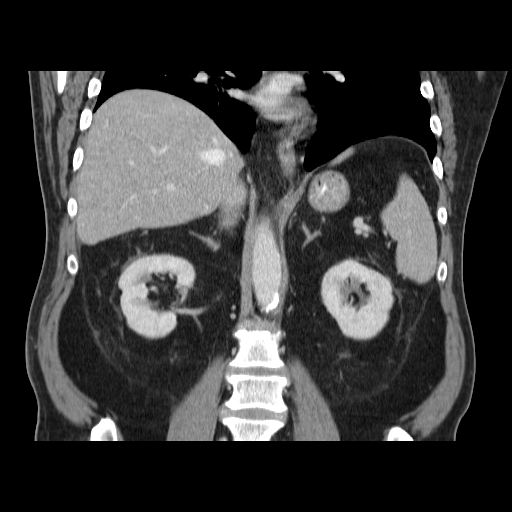

[16 of 46 positions shown; findings below may reference images not displayed]

There is
a report from prior scan from 08/24/2016 from [facility] in [place], [place] [HOSPITAL].
FINDINGS: Lower chest: The lung bases are grossly clear. Minimal dependent
subpleural atelectasis. No worrisome pulmonary lesions or pleural
effusion. Small calcified granuloma noted at the right lung base.

Hepatobiliary: There is a fluid collection noted in the gallbladder
fossa which is probably a postoperative liquified hematoma. The
small adjacent fluid collection in measuring 11 mm is likely a
resolving hepatic abscess. More superiorly there is a vague area of
low attenuation which could be a resolving/largely resolves hepatic
abscess. No obvious new lesions or other hepatic abnormalities. The
portal and hepatic veins are patent.

Pancreas: No mass, inflammation or ductal dilatation.

Spleen: Normal size.  No focal lesions.

Adrenals/Urinary Tract: The adrenal glands and kidneys are
unremarkable.

Stomach/Bowel: The stomach, duodenum, visualized small bowel and
visualize colon are unremarkable. The terminal ileum is normal. The
appendix is normal.

Vascular/Lymphatic: Moderate atherosclerotic calcifications
involving the aorta and iliac arteries. No aneurysm or dissection.
The branch vessels are patent. The major venous structures are
patent. No mesenteric or retroperitoneal mass or adenopathy.

Other: No ascites or abdominal wall hernia.

Musculoskeletal: No significant bony findings.
IMPRESSION: 1. Surgical changes from a prior cholecystectomy. There is a fluid
collection in the gallbladder fossa measuring a maximum of 3.5 cm.
This is most likely a postoperative liquified hematoma. Small
adjacent cystic area in the liver is likely a resolving abscess.
This measures 11 mm. Recommend continued clinical surveillance and
reimaging only if the patient does not improve clinically.
2. Vague area of low attenuation slightly more superiorly in the
liver. This could be a resolved/resolving abscess or possibly
hepatic infarct.
3. The remainder of the abdomen is unremarkable.
4. Moderate atherosclerotic calcifications involving the distal
aorta and iliac arteries and three-vessel coronary artery
calcifications.

## 2018-11-24 ENCOUNTER — Other Ambulatory Visit: Payer: Self-pay | Admitting: Physician Assistant

## 2018-11-24 DIAGNOSIS — B3742 Candidal balanitis: Secondary | ICD-10-CM

## 2018-12-15 DIAGNOSIS — E1169 Type 2 diabetes mellitus with other specified complication: Secondary | ICD-10-CM | POA: Diagnosis not present

## 2018-12-15 DIAGNOSIS — Z794 Long term (current) use of insulin: Secondary | ICD-10-CM | POA: Diagnosis not present

## 2018-12-16 DIAGNOSIS — E1169 Type 2 diabetes mellitus with other specified complication: Secondary | ICD-10-CM | POA: Diagnosis not present

## 2018-12-16 DIAGNOSIS — Z794 Long term (current) use of insulin: Secondary | ICD-10-CM | POA: Diagnosis not present

## 2018-12-18 DIAGNOSIS — H401111 Primary open-angle glaucoma, right eye, mild stage: Secondary | ICD-10-CM | POA: Diagnosis not present

## 2019-01-07 DIAGNOSIS — Z23 Encounter for immunization: Secondary | ICD-10-CM | POA: Diagnosis not present

## 2019-03-16 DIAGNOSIS — Z872 Personal history of diseases of the skin and subcutaneous tissue: Secondary | ICD-10-CM | POA: Diagnosis not present

## 2019-03-16 DIAGNOSIS — Z86018 Personal history of other benign neoplasm: Secondary | ICD-10-CM | POA: Diagnosis not present

## 2019-03-16 DIAGNOSIS — Z85820 Personal history of malignant melanoma of skin: Secondary | ICD-10-CM | POA: Diagnosis not present

## 2019-03-16 DIAGNOSIS — L570 Actinic keratosis: Secondary | ICD-10-CM | POA: Diagnosis not present

## 2019-03-16 DIAGNOSIS — L578 Other skin changes due to chronic exposure to nonionizing radiation: Secondary | ICD-10-CM | POA: Diagnosis not present

## 2019-03-27 ENCOUNTER — Other Ambulatory Visit: Payer: Self-pay | Admitting: Family Medicine

## 2019-03-27 DIAGNOSIS — B3742 Candidal balanitis: Secondary | ICD-10-CM

## 2019-04-06 DIAGNOSIS — E1169 Type 2 diabetes mellitus with other specified complication: Secondary | ICD-10-CM | POA: Diagnosis not present

## 2019-04-06 DIAGNOSIS — Z794 Long term (current) use of insulin: Secondary | ICD-10-CM | POA: Diagnosis not present

## 2019-04-16 LAB — MICROALBUMIN, URINE: Microalb, Ur: 190.3

## 2019-04-16 LAB — HEMOGLOBIN A1C: Hemoglobin A1C: 9.2

## 2019-06-03 ENCOUNTER — Other Ambulatory Visit: Payer: Self-pay | Admitting: Physician Assistant

## 2019-06-03 DIAGNOSIS — B3742 Candidal balanitis: Secondary | ICD-10-CM

## 2019-06-03 NOTE — Telephone Encounter (Signed)
Requested medication (s) are due for refill today:   Yes  Requested medication (s) are on the active medication list:   Yes  Future visit scheduled:   No   Last ordered: 03/27/2019  30 g  1 refill      No protocol for this medication.   Requested Prescriptions  Pending Prescriptions Disp Refills   nystatin cream (MYCOSTATIN) [Pharmacy Med Name: NYSTATIN 100,000 UNIT/GM CREAM] 30 g 1    Sig: APPLY TO AFFECTED AREA TWICE A DAY TO FORESKIN/PENIS      Off-Protocol Failed - 06/03/2019  1:31 PM      Failed - Medication not assigned to a protocol, review manually.      Passed - Valid encounter within last 12 months    Recent Outpatient Visits           9 months ago Type 2 diabetes mellitus without complication, with long-term current use of insulin [facility])   [place], [place], [place]   2 years ago Other streptococcal sepsis [facility])   [place], [place]   2 years ago [place], [place], [place]   3 years ago Type 2 diabetes mellitus without complication, with long-term current use of insulin [facility])   [place], [place], [place]

## 2019-06-25 DIAGNOSIS — H401132 Primary open-angle glaucoma, bilateral, moderate stage: Secondary | ICD-10-CM | POA: Diagnosis not present

## 2019-06-25 LAB — HM DIABETES EYE EXAM

## 2019-06-29 DIAGNOSIS — Z23 Encounter for immunization: Secondary | ICD-10-CM | POA: Diagnosis not present

## 2019-07-28 DIAGNOSIS — Z23 Encounter for immunization: Secondary | ICD-10-CM | POA: Diagnosis not present

## 2019-08-17 DIAGNOSIS — E1169 Type 2 diabetes mellitus with other specified complication: Secondary | ICD-10-CM | POA: Diagnosis not present

## 2019-08-17 DIAGNOSIS — Z794 Long term (current) use of insulin: Secondary | ICD-10-CM | POA: Diagnosis not present

## 2019-08-31 ENCOUNTER — Encounter: Payer: Self-pay | Admitting: Physician Assistant

## 2019-08-31 ENCOUNTER — Ambulatory Visit (INDEPENDENT_AMBULATORY_CARE_PROVIDER_SITE_OTHER): Payer: Medicare Other | Admitting: Physician Assistant

## 2019-08-31 ENCOUNTER — Other Ambulatory Visit: Payer: Self-pay

## 2019-08-31 VITALS — BP 110/62 | HR 105 | Temp 97.3°F | Ht 70.0 in | Wt 215.0 lb

## 2019-08-31 DIAGNOSIS — I10 Essential (primary) hypertension: Secondary | ICD-10-CM

## 2019-08-31 DIAGNOSIS — Z23 Encounter for immunization: Secondary | ICD-10-CM | POA: Diagnosis not present

## 2019-08-31 DIAGNOSIS — Z0000 Encounter for general adult medical examination without abnormal findings: Secondary | ICD-10-CM

## 2019-08-31 DIAGNOSIS — E119 Type 2 diabetes mellitus without complications: Secondary | ICD-10-CM

## 2019-08-31 DIAGNOSIS — Z125 Encounter for screening for malignant neoplasm of prostate: Secondary | ICD-10-CM

## 2019-08-31 DIAGNOSIS — R197 Diarrhea, unspecified: Secondary | ICD-10-CM | POA: Diagnosis not present

## 2019-08-31 DIAGNOSIS — Z794 Long term (current) use of insulin: Secondary | ICD-10-CM

## 2019-08-31 DIAGNOSIS — Z1159 Encounter for screening for other viral diseases: Secondary | ICD-10-CM | POA: Diagnosis not present

## 2019-08-31 MED ORDER — NYSTATIN 100000 UNIT/GM EX CREA: TOPICAL_CREAM | CUTANEOUS | 3 refills | Status: DC

## 2019-08-31 NOTE — Progress Notes (Signed)
Annual Wellness Visit     Patient: Paul Hughes, Male    DOB: [birth year], 74 y.o.   MRN: [id number] Visit Date: 08/31/2019  Today's Provider: Trinna Post, PA-C   Chief Complaint  Patient presents with  . Annual Exam   Subjective    Paul Hughes is a 74 y.o. male who presents today for his Annual Wellness Visit. He reports consuming a general diet. Pt reports exercising some. He generally feels well. He reports sleeping well. He does have additional problems to discuss today.   HPI  Last colonoscopy:10/27/2014 Hypertension, follow-up  BP Readings from Last 3 Encounters:  08/31/19 110/62  09/11/16 120/80  06/29/16 122/86   Wt Readings from Last 3 Encounters:  08/31/19 215 lb (97.5 kg)  09/05/18 205 lb (93 kg)  09/11/16 211 lb 9.6 oz (96 kg)     He was last seen for hypertension 11 months ago.  Management since that visit includes no changes.  He reports excellent compliance with treatment. He is not having side effects.  He is following a Low Sodium diet. He is exercising. He does not smoke.  Use of agents associated with hypertension: none.   Outside blood pressures are not being check. Symptoms: No chest pain No chest pressure No palpitations No dyspnea No orthopnea No paroxysmal nocturnal dyspnea No lower extremity edema No syncope   Pertinent labs: Lab Results  Component Value Date   CHOL 177 10/04/2015   HDL 30 (L) 10/04/2015   LDLCALC 83 10/04/2015   TRIG 319 (H) 10/04/2015   CHOLHDL 5.9 (H) 10/04/2015   Lab Results  Component Value Date   NA 135 09/19/2016   K 3.8 09/19/2016   CO2 23 09/19/2016   GLUCOSE 180 (H) 09/19/2016   BUN 12 09/19/2016   CREATININE 1.02 09/19/2016   CALCIUM 9.0 09/19/2016   GFRNONAA >60 09/19/2016   GFRAA >60 09/19/2016     The ASCVD Risk score (Goff DC Jr., et al., 2013) failed to calculate for the following reasons:   The valid total cholesterol range is 130 to 320 mg/dL   Diarrhea: Has 2-3  episodes of diarrhea sometimes after he eats certain meals particularly steak, biscuits, etc. He had a cholecystectomy 3 years ago due to gallstones and cholecystitis.  --------------------------------------------------------------------------------------------------- Diabetes : Patient sees Endocrinology for his diabetes.   Patient Active Problem List   Diagnosis Date Noted  . Encounter for screening colonoscopy 08/25/2014  . Acid reflux 04/17/2011  . BP (high blood pressure) 04/17/2011  . Arthritis, degenerative 04/17/2011  . Type 2 diabetes mellitus ([place]) 04/17/2011  . Detached retina 04/17/2011   Past Medical History:  Diagnosis Date  . Diabetes mellitus without complication ([place])   . GERD (gastroesophageal reflux disease)   . Hypertension    Past Surgical History:  Procedure Laterality Date  . CHOLECYSTECTOMY     with liver abscess 08/24/2016  . COLONOSCOPY  2006  . COLONOSCOPY N/A 10/27/2014   Procedure: COLONOSCOPY;  Surgeon: Robert Bellow, MD;  Location: [facility] ENDOSCOPY;  Service: Endoscopy;  Laterality: N/A;  . KNEE SURGERY Right 2011   Prosthetic knee replacement.   Social History   Tobacco Use  . Smoking status: Never Smoker  . Smokeless tobacco: Never Used  Substance Use Topics  . Alcohol use: No    Alcohol/week: 0.0 standard drinks  . Drug use: No   Social History   Socioeconomic History  . Marital status: Divorced    Spouse name: Not on file  .  Number of children: Not on file  . Years of education: Not on file  . Highest education level: Not on file  Occupational History  . Not on file  Tobacco Use  . Smoking status: Never Smoker  . Smokeless tobacco: Never Used  Substance and Sexual Activity  . Alcohol use: No    Alcohol/week: 0.0 standard drinks  . Drug use: No  . Sexual activity: Not on file  Other Topics Concern  . Not on file  Social History Narrative  . Not on file   Social Determinants of Health   Financial Resource Strain:   .  Difficulty of Paying Living Expenses:   Food Insecurity:   . Worried About Charity fundraiser in the Last Year:   . Arboriculturist in the Last Year:   Transportation Needs:   . Film/video editor (Medical):   Marland Kitchen Lack of Transportation (Non-Medical):   Physical Activity:   . Days of Exercise per Week:   . Minutes of Exercise per Session:   Stress:   . Feeling of Stress :   Social Connections:   . Frequency of Communication with Friends and Family:   . Frequency of Social Gatherings with Friends and Family:   . Attends Religious Services:   . Active Member of Clubs or Organizations:   . Attends Archivist Meetings:   Marland Kitchen Marital Status:   Intimate Partner Violence:   . Fear of Current or Ex-Partner:   . Emotionally Abused:   Marland Kitchen Physically Abused:   . Sexually Abused:    Family Status  Relation Name Status  . Mother  Deceased  . Father  Deceased   No family history on file. Allergies  Allergen Reactions  . Sulfa Antibiotics Shortness Of Breath  . Codeine        Medications: Outpatient Medications Prior to Visit  Medication Sig  . aspirin 81 MG tablet Take 81 mg by mouth daily.  Marland Kitchen atorvastatin (LIPITOR) 10 MG tablet Take 10 mg by mouth daily.  . BD PEN NEEDLE NANO U/F 32G X 4 MM MISC USE 3-4 TIMES DAILY TO INJECT INSULIN UNDER THE SKIN  . brimonidine (ALPHAGAN) 0.15 % ophthalmic solution 1 drop 3 (three) times daily.  . brimonidine (ALPHAGAN) 0.2 % ophthalmic solution INSTILL 1 DROP INTO BOTH EYES TWICE A DAY  . glucose blood (PRECISION QID TEST) test strip Use as instructed. Contour next blood test strips tests 5x daily  . HUMULIN 70/30 KWIKPEN (70-30) 100 UNIT/ML PEN Inject 50 units in the morning and 20 Units at bedtime.  . lansoprazole (PREVACID) 15 MG capsule Take 15 mg by mouth daily at 12 noon.  . latanoprost (XALATAN) 0.005 % ophthalmic solution INSTILL 1 DROP INTO BOTH EYES EVERY DAY IN THE EVENING  . lisinopril-hydrochlorothiazide  (PRINZIDE,ZESTORETIC) 10-12.5 MG per tablet Take 1 tablet by mouth daily.  . metFORMIN (GLUCOPHAGE) 1000 MG tablet Take 1,000 mg by mouth 2 (two) times daily with a meal.  . nystatin cream (MYCOSTATIN) APPLY TO AFFECTED AREA TWICE A DAY TO FORESKIN/PENIS  . vitamin B-12 (CYANOCOBALAMIN) 1000 MCG tablet Take 1,000 mcg by mouth daily.   No facility-administered medications prior to visit.    Allergies  Allergen Reactions  . Sulfa Antibiotics Shortness Of Breath  . Codeine     Patient Care Team: Paulene Floor as PCP - General (Physician Assistant) Bary Castilla Forest Gleason, MD (General Surgery)  Review of Systems  Constitutional: Negative.   HENT: Negative.  Eyes: Negative.   Respiratory: Negative.   Cardiovascular: Negative.   Gastrointestinal: Positive for diarrhea (Especially after eating, since having his gall bladder removed. ). Negative for abdominal distention, abdominal pain, anal bleeding, blood in stool, constipation, nausea, rectal pain and vomiting.  Endocrine: Negative.   Genitourinary: Negative.   Musculoskeletal: Positive for arthralgias. Negative for back pain, gait problem, joint swelling, myalgias, neck pain and neck stiffness.  Skin: Negative.   Allergic/Immunologic: Negative.   Neurological: Negative.   Hematological: Negative.   Psychiatric/Behavioral: Negative.     Last CBC No results found for: WBC, HGB, HCT, MCV, MCH, RDW, PLT Last metabolic panel Lab Results  Component Value Date   GLUCOSE 180 (H) 09/19/2016   NA 135 09/19/2016   K 3.8 09/19/2016   CL 103 09/19/2016   CO2 23 09/19/2016   BUN 12 09/19/2016   CREATININE 1.02 09/19/2016   GFRNONAA >60 09/19/2016   GFRAA >60 09/19/2016   CALCIUM 9.0 09/19/2016   PHOS 2.8 09/19/2016   ALBUMIN 3.9 09/19/2016   ANIONGAP 9 09/19/2016   Last lipids Lab Results  Component Value Date   CHOL 177 10/04/2015   HDL 30 (L) 10/04/2015   LDLCALC 83 10/04/2015   TRIG 319 (H) 10/04/2015   CHOLHDL 5.9  (H) 10/04/2015   Last hemoglobin A1c Lab Results  Component Value Date   HGBA1C 9.4 01/20/2018   Last thyroid functions No results found for: TSH, T3TOTAL, T4TOTAL, THYROIDAB Last vitamin D No results found for: 25OHVITD2, 25OHVITD3, VD25OH Last vitamin B12 and Folate No results found for: VITAMINB12, FOLATE    Objective    Vitals: BP 110/62 (BP Location: Left Arm, Patient Position: Sitting, Cuff Size: Large)   Pulse (!) 105   Temp (!) 97.3 F (36.3 C) (Temporal)   Ht 5\' 10"  (1.778 m)   Wt 215 lb (97.5 kg)   SpO2 97%   BMI 30.85 kg/m  BP Readings from Last 3 Encounters:  08/31/19 110/62  09/11/16 120/80  06/29/16 122/86      Physical Exam Constitutional:      Appearance: Normal appearance.  Cardiovascular:     Rate and Rhythm: Normal rate and regular rhythm.     Heart sounds: Normal heart sounds.  Pulmonary:     Effort: Pulmonary effort is normal.     Breath sounds: Normal breath sounds.  Skin:    General: Skin is warm and dry.  Neurological:     Mental Status: He is alert and oriented to person, place, and time. Mental status is at baseline.  Psychiatric:        Mood and Affect: Mood normal.        Behavior: Behavior normal.      Most recent functional status assessment: In your present state of health, do you have any difficulty performing the following activities: 08/31/2019  Hearing? N  Vision? N  Difficulty concentrating or making decisions? N  Walking or climbing stairs? Y  Dressing or bathing? N  Doing errands, shopping? N  Some recent data might be hidden    Most recent fall risk assessment: Fall Risk  03/19/2018  Falls in the past year? 0  Comment Emmi Telephone Survey: data to providers prior to load     Most recent depression screenings: PHQ 2/9 Scores 08/31/2019 10/03/2015  PHQ - 2 Score 0 0  PHQ- 9 Score 0 -    Most recent cognitive screening: No flowsheet data found.  No results found for any visits on 08/31/19.  Assessment &  Plan      Annual wellness visit done today including the all of the following: Reviewed patient's Family Medical History Reviewed and updated list of patient's medical providers Assessment of cognitive impairment was done Assessed patient's functional ability Established a written schedule for health screening [place] Completed and Reviewed  Exercise Activities and Dietary recommendations Goals   None     Immunization History  Administered Date(s) Administered  . Fluad Quad(high Dose 65+) 01/07/2019  . Influenza, High Dose Seasonal PF 02/23/2016, 01/16/2017, 01/08/2018  . Influenza, Seasonal, Injecte, Preservative Fre 02/17/2013, 01/19/2014  . Influenza,inj,Quad PF,6+ Mos 01/27/2015  . Influenza-Unspecified 02/20/2012, 12/29/2017  . Pneumococcal Polysaccharide-23 01/16/2011    Health Maintenance  Topic Date Due  . Hepatitis C Screening  Never done  . COVID-19 Vaccine (1) Never done  . TETANUS/TDAP  Never done  . PNA vac Low Risk Adult (2 of 2 - PCV13) 01/16/2012  . HEMOGLOBIN A1C  07/21/2018  . OPHTHALMOLOGY EXAM  06/17/2019  . INFLUENZA VACCINE  11/29/2019  . FOOT EXAM  08/30/2020  . COLONOSCOPY  10/26/2024     Discussed health benefits of physical activity, and encouraged him to engage in regular exercise appropriate for his age and condition.     Return in about 1 year (around 08/30/2020) for AWV.     ITrinna Post, PA-C, have reviewed all documentation for this visit. The documentation on 08/31/19 for the exam, diagnosis, procedures, and orders are all accurate and complete.  1. Medicare annual wellness visit, subsequent  Update prevnar. Discussed calling insurance about shingles vaccine.   2. Type 2 diabetes mellitus without complication, with long-term current use of insulin ([place])  Followed by [facility], last a1c 9.2. He is continuing to follow with them   3. Essential hypertension   4. Need for vaccination against Streptococcus  pneumoniae  - Pneumococcal conjugate vaccine 13-valent  5. Prostate cancer screening  - PSA  6. Encounter for hepatitis C screening test for low risk patient  - Hepatitis c antibody (reflex)  7. Diarrhea, unspecified type  Advised patient this is likely due to cholecystectomy and high fat meals. Advised to limit fat intake and adhere to low fat meals in order to help symptoms. May use imodium PRN.   ITrinna Post, PA-C, have reviewed all documentation for this visit. The documentation on 09/01/19 for the exam, diagnosis, procedures, and orders are all accurate and complete.     Paulene Floor  [facility] [phone] (phone) [phone] (fax)  [place]

## 2019-08-31 NOTE — Patient Instructions (Signed)
Call insurance about shingles vaccine - Shingrix - it is two doses, see where it's paid for

## 2019-09-07 DIAGNOSIS — Z125 Encounter for screening for malignant neoplasm of prostate: Secondary | ICD-10-CM | POA: Diagnosis not present

## 2019-09-07 DIAGNOSIS — Z1159 Encounter for screening for other viral diseases: Secondary | ICD-10-CM | POA: Diagnosis not present

## 2019-09-08 ENCOUNTER — Telehealth: Payer: Self-pay

## 2019-09-08 LAB — HCV COMMENT:

## 2019-09-08 LAB — PSA: Prostate Specific Ag, Serum: 5.7 ng/mL — ABNORMAL HIGH (ref 0.0–4.0)

## 2019-09-08 LAB — HEPATITIS C ANTIBODY (REFLEX): HCV Ab: 0.3 s/co ratio (ref 0.0–0.9)

## 2019-09-08 NOTE — Telephone Encounter (Signed)
Patient advised as directed below. Patient wants to hold on for the referral.

## 2019-09-08 NOTE — Telephone Encounter (Signed)
-----   Message from Trinna Post, [place] sent at 09/08/2019  9:33 AM EDT ----- Hepatitis C screening negative. His PSA is slightly elevated. This can happen for a number of reasons including prostate enlargement, infection, trauma and in some cases cancer. I would recommend we refer him to the urologist for further workup. If he is agreeable, OK to place referral to [facility].

## 2019-10-16 ENCOUNTER — Ambulatory Visit: Payer: Self-pay | Admitting: Physician Assistant

## 2019-11-16 DIAGNOSIS — E1169 Type 2 diabetes mellitus with other specified complication: Secondary | ICD-10-CM | POA: Diagnosis not present

## 2019-11-16 DIAGNOSIS — Z794 Long term (current) use of insulin: Secondary | ICD-10-CM | POA: Diagnosis not present

## 2019-11-24 DIAGNOSIS — E1169 Type 2 diabetes mellitus with other specified complication: Secondary | ICD-10-CM | POA: Diagnosis not present

## 2019-11-24 DIAGNOSIS — Z794 Long term (current) use of insulin: Secondary | ICD-10-CM | POA: Diagnosis not present

## 2019-12-22 ENCOUNTER — Other Ambulatory Visit: Payer: Self-pay | Admitting: Physician Assistant

## 2019-12-22 NOTE — Telephone Encounter (Signed)
Requested medication (s) are due for refill today -yes  Requested medication (s) are on the active medication list -yes  Future visit scheduled -yes  Last refill: 08/31/19 3RF  Notes to clinic: Request RF of medication not assigned protocol.  Requested Prescriptions  Pending Prescriptions Disp Refills   nystatin cream (MYCOSTATIN) [Pharmacy Med Name: NYSTATIN 100,000 UNIT/GM CREAM] 30 g 3    Sig: APPLY TO AFFECTED AREA TWICE A DAY TO FORESKIN/PENIS      Off-Protocol Failed - 12/22/2019 10:07 AM      Failed - Medication not assigned to a protocol, review manually.      Failed - Valid encounter within last 12 months    Recent Outpatient Visits           1 year ago Type 2 diabetes mellitus without complication, with long-term current use of insulin [facility])   [place], [place], [place]   3 years ago Other streptococcal sepsis [facility])   [place], [place], [place]   3 years ago [place], [place]   4 years ago Type 2 diabetes mellitus without complication, with long-term current use of insulin [facility])   [place], [place]                  Requested Prescriptions  Pending Prescriptions Disp Refills   nystatin cream (MYCOSTATIN) [Pharmacy Med Name: NYSTATIN 100,000 UNIT/GM CREAM] 30 g 3    Sig: APPLY TO AFFECTED AREA TWICE A DAY TO FORESKIN/PENIS      Off-Protocol Failed - 12/22/2019 10:07 AM      Failed - Medication not assigned to a protocol, review manually.      Failed - Valid encounter within last 12 months    Recent Outpatient Visits           1 year ago Type 2 diabetes mellitus without complication, with long-term current use of insulin [facility])   [place], [place], [place]   3 years ago Other streptococcal sepsis [facility])   [place], [place]   3 years ago [place],  [place], [place]   4 years ago Type 2 diabetes mellitus without complication, with long-term current use of insulin [facility])   [place], [place]

## 2019-12-24 DIAGNOSIS — H401132 Primary open-angle glaucoma, bilateral, moderate stage: Secondary | ICD-10-CM | POA: Diagnosis not present

## 2020-01-19 DIAGNOSIS — Z23 Encounter for immunization: Secondary | ICD-10-CM | POA: Diagnosis not present

## 2020-03-07 ENCOUNTER — Ambulatory Visit (INDEPENDENT_AMBULATORY_CARE_PROVIDER_SITE_OTHER): Payer: Medicare Other | Admitting: Physician Assistant

## 2020-03-07 DIAGNOSIS — B029 Zoster without complications: Secondary | ICD-10-CM | POA: Diagnosis not present

## 2020-03-07 MED ORDER — HYDROCODONE-ACETAMINOPHEN 5-325 MG PO TABS: 1.0000 | ORAL_TABLET | Freq: Three times a day (TID) | ORAL | 0 refills | Status: AC | PRN

## 2020-03-07 MED ORDER — VALACYCLOVIR HCL 1 G PO TABS: 1000.0000 mg | ORAL_TABLET | Freq: Three times a day (TID) | ORAL | 0 refills | Status: AC

## 2020-03-07 NOTE — Progress Notes (Signed)
MyChart Video Visit    Virtual Visit via Video Note   This visit type was conducted due to national recommendations for restrictions regarding the COVID-19 Pandemic (e.g. social distancing) in an effort to limit this patient's exposure and mitigate transmission in our community. This patient is at least at moderate risk for complications without adequate follow up. This format is felt to be most appropriate for this patient at this time. Physical exam was limited by quality of the video and audio technology used for the visit.   Patient location: Home Provider location: Office   I discussed the limitations of evaluation and management by telemedicine and the availability of in person appointments. The patient expressed understanding and agreed to proceed.  Patient: Paul Hughes   DOB: [birth year]   74 y.o. Male  MRN: [id number] Visit Date: 03/07/2020  Today's healthcare provider: Trinna Post, PA-C   Chief Complaint  Patient presents with  . Rash   Subjective    HPI   Patient presents with facial rash x 3 days. He reported a burning sensation on the left side of his face followed by the appearance of a blistery rash. He reports it is extremely painful and swollen and extends into his ear and scalp. He reports he didn't know what it was and so shaved his face as normal which resulted in opening of the lesions and bleeding. He reports there are some blisters which still have fluid in them. He denies rash in his eye or pain in his eye.    Medications: Outpatient Medications Prior to Visit  Medication Sig  . aspirin 81 MG tablet Take 81 mg by mouth daily.  Marland Kitchen atorvastatin (LIPITOR) 10 MG tablet Take 10 mg by mouth daily.  . BD PEN NEEDLE NANO U/F 32G X 4 MM MISC USE 3-4 TIMES DAILY TO INJECT INSULIN UNDER THE SKIN  . brimonidine (ALPHAGAN) 0.15 % ophthalmic solution 1 drop 3 (three) times daily.  . brimonidine (ALPHAGAN) 0.2 % ophthalmic solution INSTILL 1 DROP INTO BOTH  EYES TWICE A DAY  . glucose blood (PRECISION QID TEST) test strip Use as instructed. Contour next blood test strips tests 5x daily  . HUMULIN 70/30 KWIKPEN (70-30) 100 UNIT/ML PEN Inject 50 units in the morning and 20 Units at bedtime.  . lansoprazole (PREVACID) 15 MG capsule Take 15 mg by mouth daily at 12 noon.  . latanoprost (XALATAN) 0.005 % ophthalmic solution INSTILL 1 DROP INTO BOTH EYES EVERY DAY IN THE EVENING  . lisinopril-hydrochlorothiazide (PRINZIDE,ZESTORETIC) 10-12.5 MG per tablet Take 1 tablet by mouth daily.  . metFORMIN (GLUCOPHAGE) 1000 MG tablet Take 1,000 mg by mouth 2 (two) times daily with a meal.  . nystatin cream (MYCOSTATIN) APPLY TO AFFECTED AREA TWICE A DAY TO FORESKIN/PENIS  . vitamin B-12 (CYANOCOBALAMIN) 1000 MCG tablet Take 1,000 mcg by mouth daily.   No facility-administered medications prior to visit.    Review of Systems    Objective    There were no vitals taken for this visit.   Physical Exam Constitutional:      Appearance: Normal appearance.  HENT:     Head:   Pulmonary:     Effort: Pulmonary effort is normal. No respiratory distress.  Neurological:     Mental Status: He is alert.  Psychiatric:        Mood and Affect: Mood normal.        Behavior: Behavior normal.        Assessment & Plan  1. Herpes zoster without complication  - valACYclovir (VALTREX) 1000 MG tablet; Take 1 tablet (1,000 mg total) by mouth 3 (three) times daily for 7 days.  Dispense: 21 tablet; Refill: 0 - HYDROcodone-acetaminophen (NORCO/VICODIN) 5-325 MG tablet; Take 1 tablet by mouth every 8 (eight) hours as needed for up to 7 days for moderate pain.  Dispense: 21 tablet; Refill: 0   Return if symptoms worsen or fail to improve.     I discussed the assessment and treatment plan with the patient. The patient was provided an opportunity to ask questions and all were answered. The patient agreed with the plan and demonstrated an understanding of the  instructions.   The patient was advised to call back or seek an in-person evaluation if the symptoms worsen or if the condition fails to improve as anticipated.   ITrinna Post, PA-C, have reviewed all documentation for this visit. The documentation on 03/07/20 for the exam, diagnosis, procedures, and orders are all accurate and complete.  The entirety of the information documented in the History of Present Illness, Review of Systems and Physical Exam were personally obtained by me. Portions of this information were initially documented by [facility] and reviewed by me for thoroughness and accuracy.    Paulene Floor [facility] [phone] (phone) [phone] (fax)  [place]

## 2020-03-15 DIAGNOSIS — B029 Zoster without complications: Secondary | ICD-10-CM | POA: Diagnosis not present

## 2020-03-15 DIAGNOSIS — L578 Other skin changes due to chronic exposure to nonionizing radiation: Secondary | ICD-10-CM | POA: Diagnosis not present

## 2020-03-15 DIAGNOSIS — Z86018 Personal history of other benign neoplasm: Secondary | ICD-10-CM | POA: Diagnosis not present

## 2020-03-15 DIAGNOSIS — Z85820 Personal history of malignant melanoma of skin: Secondary | ICD-10-CM | POA: Diagnosis not present

## 2020-03-15 DIAGNOSIS — Z872 Personal history of diseases of the skin and subcutaneous tissue: Secondary | ICD-10-CM | POA: Diagnosis not present

## 2020-03-28 DIAGNOSIS — Z23 Encounter for immunization: Secondary | ICD-10-CM | POA: Diagnosis not present

## 2020-06-13 DIAGNOSIS — Z794 Long term (current) use of insulin: Secondary | ICD-10-CM | POA: Diagnosis not present

## 2020-06-13 DIAGNOSIS — E6609 Other obesity due to excess calories: Secondary | ICD-10-CM | POA: Diagnosis not present

## 2020-06-13 DIAGNOSIS — E1169 Type 2 diabetes mellitus with other specified complication: Secondary | ICD-10-CM | POA: Diagnosis not present

## 2020-06-21 DIAGNOSIS — Z794 Long term (current) use of insulin: Secondary | ICD-10-CM | POA: Diagnosis not present

## 2020-06-21 DIAGNOSIS — E1169 Type 2 diabetes mellitus with other specified complication: Secondary | ICD-10-CM | POA: Diagnosis not present

## 2020-08-30 ENCOUNTER — Encounter: Payer: Medicare Other | Admitting: Physician Assistant

## 2020-12-28 DIAGNOSIS — E119 Type 2 diabetes mellitus without complications: Secondary | ICD-10-CM | POA: Diagnosis not present

## 2020-12-28 DIAGNOSIS — H401111 Primary open-angle glaucoma, right eye, mild stage: Secondary | ICD-10-CM | POA: Diagnosis not present

## 2020-12-28 DIAGNOSIS — H27121 Anterior dislocation of lens, right eye: Secondary | ICD-10-CM | POA: Diagnosis not present

## 2020-12-28 DIAGNOSIS — H401122 Primary open-angle glaucoma, left eye, moderate stage: Secondary | ICD-10-CM | POA: Diagnosis not present

## 2021-01-14 DIAGNOSIS — R051 Acute cough: Secondary | ICD-10-CM | POA: Diagnosis not present

## 2021-01-14 DIAGNOSIS — U071 COVID-19: Secondary | ICD-10-CM | POA: Diagnosis not present

## 2021-01-14 DIAGNOSIS — J069 Acute upper respiratory infection, unspecified: Secondary | ICD-10-CM | POA: Diagnosis not present

## 2021-02-27 DIAGNOSIS — E1169 Type 2 diabetes mellitus with other specified complication: Secondary | ICD-10-CM | POA: Diagnosis not present

## 2021-02-27 DIAGNOSIS — E1165 Type 2 diabetes mellitus with hyperglycemia: Secondary | ICD-10-CM | POA: Diagnosis not present

## 2021-02-27 DIAGNOSIS — Z794 Long term (current) use of insulin: Secondary | ICD-10-CM | POA: Diagnosis not present

## 2021-02-27 DIAGNOSIS — R6889 Other general symptoms and signs: Secondary | ICD-10-CM | POA: Diagnosis not present

## 2021-02-27 DIAGNOSIS — E669 Obesity, unspecified: Secondary | ICD-10-CM | POA: Diagnosis not present

## 2021-02-27 DIAGNOSIS — Z23 Encounter for immunization: Secondary | ICD-10-CM | POA: Diagnosis not present

## 2021-03-15 DIAGNOSIS — Z85820 Personal history of malignant melanoma of skin: Secondary | ICD-10-CM | POA: Diagnosis not present

## 2021-03-15 DIAGNOSIS — Z86018 Personal history of other benign neoplasm: Secondary | ICD-10-CM | POA: Diagnosis not present

## 2021-03-15 DIAGNOSIS — L578 Other skin changes due to chronic exposure to nonionizing radiation: Secondary | ICD-10-CM | POA: Diagnosis not present

## 2021-03-15 DIAGNOSIS — Z872 Personal history of diseases of the skin and subcutaneous tissue: Secondary | ICD-10-CM | POA: Diagnosis not present
